# Patient Record
Sex: Male | Born: 1945 | Race: White | Hispanic: No | Marital: Married | Smoking: Former smoker
Health system: Southern US, Community
[De-identification: ages and names within clinical notes are randomized; demographics above are authoritative.]

## PROBLEM LIST (undated history)

## (undated) DIAGNOSIS — L570 Actinic keratosis: Secondary | ICD-10-CM

## (undated) DIAGNOSIS — C4490 Unspecified malignant neoplasm of skin, unspecified: Secondary | ICD-10-CM

## (undated) DIAGNOSIS — N200 Calculus of kidney: Secondary | ICD-10-CM

## (undated) DIAGNOSIS — G629 Polyneuropathy, unspecified: Secondary | ICD-10-CM

## (undated) DIAGNOSIS — M1990 Unspecified osteoarthritis, unspecified site: Secondary | ICD-10-CM

## (undated) DIAGNOSIS — I359 Nonrheumatic aortic valve disorder, unspecified: Secondary | ICD-10-CM

## (undated) DIAGNOSIS — N529 Male erectile dysfunction, unspecified: Secondary | ICD-10-CM

## (undated) HISTORY — DX: Calculus of kidney: N20.0

## (undated) HISTORY — PX: CATARACT EXTRACTION: SUR2

## (undated) HISTORY — DX: Nonrheumatic aortic valve disorder, unspecified: I35.9

## (undated) HISTORY — DX: Actinic keratosis: L57.0

## (undated) HISTORY — DX: Polyneuropathy, unspecified: G62.9

## (undated) HISTORY — DX: Male erectile dysfunction, unspecified: N52.9

## (undated) HISTORY — DX: Unspecified malignant neoplasm of skin, unspecified: C44.90

## (undated) HISTORY — PX: CARPAL TUNNEL RELEASE: SHX101

## (undated) HISTORY — PX: KIDNEY STONE SURGERY: SHX686

---

## 1974-12-18 HISTORY — PX: BACK SURGERY: SHX140

## 2001-10-18 DIAGNOSIS — G609 Hereditary and idiopathic neuropathy, unspecified: Secondary | ICD-10-CM | POA: Insufficient documentation

## 2006-01-24 DIAGNOSIS — N529 Male erectile dysfunction, unspecified: Secondary | ICD-10-CM | POA: Insufficient documentation

## 2008-11-23 ENCOUNTER — Ambulatory Visit (HOSPITAL_COMMUNITY): Admission: RE | Admit: 2008-11-23 | Discharge: 2008-11-23 | Payer: Self-pay | Admitting: Neurosurgery

## 2008-12-28 ENCOUNTER — Ambulatory Visit (HOSPITAL_COMMUNITY): Admission: RE | Admit: 2008-12-28 | Discharge: 2008-12-28 | Payer: Self-pay | Admitting: Neurosurgery

## 2011-04-03 LAB — CBC
HCT: 41.7 % (ref 39.0–52.0)
Hemoglobin: 14.3 g/dL (ref 13.0–17.0)
MCHC: 34.2 g/dL (ref 30.0–36.0)
MCV: 91.8 fL (ref 78.0–100.0)
Platelets: 245 10*3/uL (ref 150–400)
RBC: 4.54 MIL/uL (ref 4.22–5.81)
RDW: 12.9 % (ref 11.5–15.5)
WBC: 5.8 10*3/uL (ref 4.0–10.5)

## 2011-05-02 NOTE — Op Note (Signed)
Matthew Berry, Matthew Berry               ACCOUNT NO.:  [id number]   MEDICAL RECORD NO.:  [id number]          PATIENT TYPE:  AMB   LOCATION:  SDS                          FACILITY:  [facility]   PHYSICIAN:  Hewitt Shorts, M.D.DATE OF BIRTH:  Nov 20, 1946   DATE OF PROCEDURE:  12/28/2008  DATE OF DISCHARGE:  12/28/2008                               OPERATIVE REPORT   PREOPERATIVE DIAGNOSIS:  Bilateral carpal tunnel syndrome.   POSTOPERATIVE DIAGNOSIS:  Bilateral carpal tunnel syndrome.   PROCEDURE:  Right carpal tunnel release.   SURGEON:  Hewitt Shorts, MD   ANESTHESIA:  Bier block with intravenous sedation.   INDICATIONS:  The patient is a 65 year old man with bilateral carpal  tunnel syndrome.  He is about 5-week status post a left carpal tunnel  release.  He has done quite well with that.  He returns now for right  carpal tunnel release.   PROCEDURE:  The patient was brought to the operating room.  A Bier block  was administered by Dr. Katrinka Blazing from the Anesthesia Service and then the  right upper extremity was prepped with Betadine soap and solution and  draped in sterile fashion.  An incision was made on the proximal right  palm, just medial to and parallely thenar crease.  Dissection was  carried down through the subcutaneous tissue to the tranverse carpal  ligament that was carefully divided from distal to proximal.  There was  notable thickening in the mid portion of the tranverse carpal ligament  and presumably this is where the compression was occurring.  The  tranverse carpal ligament was opened fully from distal to proximal and  good decompression of the underlying median nerve was achieved and care  was taken during the dissection to leave the underlying median nerve  undisturbed.  We then proceeded with closure.  The subcutaneous and  subcuticular were closed with interrupted inverted 2-0 undyed Vicryl  sutures.  The skin was reapproximated with interrupted horizontal  mattress sutures with a 4-0 nylon suture.  The wound was dressed with  Adaptic and sterile gauze and wrapped in a Kling, and then the  tourniquet was released. The patient was subsequently transferred to the  recovery room for further care, to be discharged to home when stable.  He was given instruction to keep the right upper extremity elevated and  he has a sling at home to use to help with that.  His daughters are  going to do dressing changes with gauze, sponges, and Kling, and he is  to return in a couple weeks for suture removal.  Sponge and needle count  were correct.  Estimated blood loss was nil.      Hewitt Shorts, M.D.  Electronically Signed     RWN/MEDQ  D:  12/28/2008  T:  12/28/2008  Job:  [id number]

## 2011-05-02 NOTE — Op Note (Signed)
NAMEJIAN, Matthew Berry               ACCOUNT NO.:  [id number]   MEDICAL RECORD NO.:  [id number]          PATIENT TYPE:  AMB   LOCATION:  SDS                          FACILITY:  [facility]   PHYSICIAN:  Hewitt Shorts, M.D.DATE OF BIRTH:  November 19, 1946   DATE OF PROCEDURE:  DATE OF DISCHARGE:                               OPERATIVE REPORT   PREOPERATIVE DIAGNOSES:  Bilateral carpal tunnel syndrome.   POSTOPERATIVE DIAGNOSES:  Bilateral carpal tunnel syndrome.   PROCEDURE:  Left carpal tunnel release.   SURGEON:  Hewitt Shorts, MD.   ANESTHESIA:  Bier block with intravenous sedation.   INDICATIONS:  The patient is a 65 year old man who presented with  bilateral carpal tunnel syndrome, right slightly worse than the left,  both severe.  The patient felt he was more symptomatic on the left and  have requested a left carpal tunnel release, and the patient was brought  to surgery.   PROCEDURE:  The patient was brought to the operating room, a Bier block  was administered to the distal left upper extremity and then the distal  left upper extremity was prepped with Betadine soap and solution, and  draped in a sterile fashion.  An incision was made medial to the left  thenar crease and dissection was carried down through the subcutaneous  tissue to the tranverse carpal ligament that was carefully divided from  distal to proximal taking care to leave the underlying median nerve  undisturbed.  It was opened in its full extend from distal to proximal.  The ligament was noted to be thickened and a clear decompression was  achieved as well as divided.  Once the ligament was fully divided, we  checked for hemostasis which was confirmed, and then we proceeded with  closure.  The subcutaneous layer was closed with interrupted inverted 2-  0 undyed Vicryl sutures. The skin was reapproximated with interrupted  horizontal mattress 4-0 nylon sutures.  The wound was dressed with  Adaptic and gauze  fluffs, and wrapped in a Kling.  The procedure was  tolerated well.  The estimated blood loss was nil.  Sponge and needle  count were correct.  Following the surgery, the patient was transferred  to the recovery room for further care.  To be discharged to home when  stable.      Hewitt Shorts, M.D.  Electronically Signed     RWN/MEDQ  D:  11/23/2008  T:  11/24/2008  Job:  [id number]

## 2011-09-22 LAB — COMPREHENSIVE METABOLIC PANEL
ALT: 21 U/L (ref 0–53)
AST: 20 U/L (ref 0–37)
Albumin: 4.1 g/dL (ref 3.5–5.2)
Alkaline Phosphatase: 71 U/L (ref 39–117)
BUN: 15 mg/dL (ref 6–23)
CO2: 29 mEq/L (ref 19–32)
Calcium: 9.5 mg/dL (ref 8.4–10.5)
Chloride: 107 mEq/L (ref 96–112)
Creatinine, Ser: 0.87 mg/dL (ref 0.4–1.5)
GFR calc Af Amer: >60 mL/min (ref >60)
GFR calc non Af Amer: >60 mL/min (ref >60)
Glucose, Bld: 91 mg/dL (ref 70–99)
Potassium: 4.5 mEq/L (ref 3.5–5.1)
Sodium: 139 mEq/L (ref 135–145)
Total Bilirubin: 0.9 mg/dL (ref 0.3–1.2)
Total Protein: 6.3 g/dL (ref 6.0–8.3)

## 2011-09-22 LAB — CBC
HCT: 42.5 % (ref 39.0–52.0)
Hemoglobin: 14.8 g/dL (ref 13.0–17.0)
MCHC: 34.9 g/dL (ref 30.0–36.0)
MCV: 91.7 fL (ref 78.0–100.0)
Platelets: 217 10*3/uL (ref 150–400)
RBC: 4.63 MIL/uL (ref 4.22–5.81)
RDW: 13.1 % (ref 11.5–15.5)
WBC: 6.8 10*3/uL (ref 4.0–10.5)

## 2012-10-11 ENCOUNTER — Other Ambulatory Visit: Payer: Self-pay | Admitting: Neurosurgery

## 2012-11-11 ENCOUNTER — Encounter (HOSPITAL_COMMUNITY): Payer: Self-pay | Admitting: Pharmacy Technician

## 2012-11-12 ENCOUNTER — Encounter (HOSPITAL_COMMUNITY): Payer: Self-pay

## 2012-11-12 ENCOUNTER — Encounter (HOSPITAL_COMMUNITY)
Admission: RE | Admit: 2012-11-12 | Discharge: 2012-11-12 | Disposition: A | Discharge status: Home / Self Care | Payer: Medicare Other | Source: Ambulatory Visit | Attending: Neurosurgery | Admitting: Neurosurgery

## 2012-11-12 HISTORY — DX: Unspecified osteoarthritis, unspecified site: M19.90

## 2012-11-12 LAB — CBC
MCHC: 33.6 g/dL (ref 30.0–36.0)
Platelets: 206 10*3/uL (ref 150–400)
RDW: 13.2 % (ref 11.5–15.5)

## 2012-11-12 LAB — SURGICAL PCR SCREEN
MRSA, PCR: POSITIVE — AB
Staphylococcus aureus: POSITIVE — AB

## 2012-11-12 LAB — BASIC METABOLIC PANEL
GFR calc Af Amer: >90 mL/min (ref >90)
GFR calc non Af Amer: 86 mL/min — ABNORMAL LOW (ref >90)
Potassium: 5.1 mEq/L (ref 3.5–5.1)
Sodium: 140 mEq/L (ref 135–145)

## 2012-11-12 NOTE — Pre-Procedure Instructions (Signed)
20 CORRI BLAUER  11/12/2012   Your procedure is scheduled on:  Thursday December 5  Report to [facility] Stay [facility] at 5:30 AM.  Call this number if you have problems the morning of surgery: [phone]   Remember:   Do not eat or drink:After Midnight.    Take these medicines the morning of surgery with A SIP OF WATER: none   Do not wear jewelry, make-up or nail polish.  Do not wear lotions, powders, or perfumes. You may wear deodorant.  Do not shave 48 hours prior to surgery. Men may shave face and neck.  Do not bring valuables to the hospital.  Contacts, dentures or bridgework may not be worn into surgery.  Leave suitcase in the car. After surgery it may be brought to your room.  For patients admitted to the hospital, checkout time is 11:00 AM the day of discharge.   Patients discharged the day of surgery will not be allowed to drive home.  Name and phone number of your driver: NA  Special Instructions: Shower using CHG 2 nights before surgery and the night before surgery.  If you shower the day of surgery use CHG.  Use special wash - you have one bottle of CHG for all showers.  You should use approximately 1/3 of the bottle for each shower.   Please read over the following fact sheets that you were given: Pain Booklet, Coughing and Deep Breathing and Surgical Site Infection Prevention

## 2012-11-20 MED ORDER — CEFAZOLIN SODIUM-DEXTROSE 2-3 GM-% IV SOLR
2.0000 g | INTRAVENOUS | Status: AC
Start: 2012-11-21 — End: 2012-11-21
  Administered 2012-11-21: 22 g via INTRAVENOUS
  Filled 2012-11-20: qty 50

## 2012-11-21 ENCOUNTER — Encounter (HOSPITAL_COMMUNITY): Payer: Self-pay | Admitting: Anesthesiology

## 2012-11-21 ENCOUNTER — Ambulatory Visit (HOSPITAL_COMMUNITY): Payer: Medicare Other | Admitting: Anesthesiology

## 2012-11-21 ENCOUNTER — Ambulatory Visit (HOSPITAL_COMMUNITY): Payer: Medicare Other

## 2012-11-21 ENCOUNTER — Encounter (HOSPITAL_COMMUNITY)
Admission: RE | Disposition: A | Discharge status: Home / Self Care | Payer: Self-pay | Source: Ambulatory Visit | Attending: Neurosurgery

## 2012-11-21 ENCOUNTER — Ambulatory Visit (HOSPITAL_COMMUNITY)
Admission: RE | Admit: 2012-11-21 | Discharge: 2012-11-22 | Disposition: A | Discharge status: Home / Self Care | Payer: Medicare Other | Source: Ambulatory Visit | Attending: Neurosurgery | Admitting: Neurosurgery

## 2012-11-21 DIAGNOSIS — Z01812 Encounter for preprocedural laboratory examination: Secondary | ICD-10-CM | POA: Insufficient documentation

## 2012-11-21 DIAGNOSIS — M5137 Other intervertebral disc degeneration, lumbosacral region: Secondary | ICD-10-CM | POA: Insufficient documentation

## 2012-11-21 DIAGNOSIS — M5126 Other intervertebral disc displacement, lumbar region: Secondary | ICD-10-CM | POA: Insufficient documentation

## 2012-11-21 DIAGNOSIS — M51379 Other intervertebral disc degeneration, lumbosacral region without mention of lumbar back pain or lower extremity pain: Secondary | ICD-10-CM | POA: Insufficient documentation

## 2012-11-21 DIAGNOSIS — M47817 Spondylosis without myelopathy or radiculopathy, lumbosacral region: Secondary | ICD-10-CM | POA: Insufficient documentation

## 2012-11-21 DIAGNOSIS — M48062 Spinal stenosis, lumbar region with neurogenic claudication: Secondary | ICD-10-CM | POA: Insufficient documentation

## 2012-11-21 HISTORY — PX: LUMBAR LAMINECTOMY/DECOMPRESSION MICRODISCECTOMY: SHX5026

## 2012-11-21 SURGERY — LUMBAR LAMINECTOMY/DECOMPRESSION MICRODISCECTOMY 2 LEVELS
Anesthesia: General | Site: Back | Wound class: Clean

## 2012-11-21 MED ORDER — ONDANSETRON HCL 4 MG/2ML IJ SOLN
INTRAMUSCULAR | Status: DC | PRN
  Administered 2012-11-21: 4 mg via INTRAVENOUS

## 2012-11-21 MED ORDER — SODIUM CHLORIDE 0.9 % IV SOLN
INTRAVENOUS | Status: AC
  Filled 2012-11-21: qty 500

## 2012-11-21 MED ORDER — ACETAMINOPHEN 10 MG/ML IV SOLN
1000.0000 mg | Freq: Four times a day (QID) | INTRAVENOUS | Status: DC
  Administered 2012-11-21 – 2012-11-22 (×3): 1000 mg via INTRAVENOUS
  Filled 2012-11-21 (×5): qty 100

## 2012-11-21 MED ORDER — ACETAMINOPHEN 10 MG/ML IV SOLN
INTRAVENOUS | Status: AC
  Filled 2012-11-21: qty 100

## 2012-11-21 MED ORDER — HEMOSTATIC AGENTS (NO CHARGE) OPTIME
TOPICAL | Status: DC | PRN
  Administered 2012-11-21 (×2): 1 via TOPICAL

## 2012-11-21 MED ORDER — PHENOL 1.4 % MT LIQD
1.0000 | OROMUCOSAL | Status: DC | PRN
  Administered 2012-11-21: 1 via OROMUCOSAL
  Filled 2012-11-21: qty 177

## 2012-11-21 MED ORDER — GLYCOPYRROLATE 0.2 MG/ML IJ SOLN
INTRAMUSCULAR | Status: DC | PRN
  Administered 2012-11-21: .7 mg via INTRAVENOUS

## 2012-11-21 MED ORDER — MORPHINE SULFATE 4 MG/ML IJ SOLN: 4.0000 mg | INTRAMUSCULAR | Status: DC | PRN

## 2012-11-21 MED ORDER — DEXTROSE-NACL 5-0.45 % IV SOLN: INTRAVENOUS | Status: DC

## 2012-11-21 MED ORDER — LACTATED RINGERS IV SOLN
INTRAVENOUS | Status: DC | PRN
  Administered 2012-11-21 (×2): via INTRAVENOUS

## 2012-11-21 MED ORDER — KETOROLAC TROMETHAMINE 30 MG/ML IJ SOLN
30.0000 mg | Freq: Four times a day (QID) | INTRAMUSCULAR | Status: DC
  Administered 2012-11-21 – 2012-11-22 (×4): 30 mg via INTRAVENOUS
  Filled 2012-11-21 (×9): qty 1

## 2012-11-21 MED ORDER — ACETAMINOPHEN 10 MG/ML IV SOLN
1000.0000 mg | Freq: Once | INTRAVENOUS | Status: AC | PRN
  Administered 2012-11-21: 1000 mg via INTRAVENOUS

## 2012-11-21 MED ORDER — MENTHOL 3 MG MT LOZG
1.0000 | LOZENGE | OROMUCOSAL | Status: DC | PRN
  Administered 2012-11-22: 3 mg via ORAL
  Filled 2012-11-21: qty 9

## 2012-11-21 MED ORDER — CHLORHEXIDINE GLUCONATE CLOTH 2 % EX PADS
6.0000 | MEDICATED_PAD | Freq: Every day | CUTANEOUS | Status: DC
  Administered 2012-11-22: 6 via TOPICAL

## 2012-11-21 MED ORDER — BUPIVACAINE HCL (PF) 0.5 % IJ SOLN
INTRAMUSCULAR | Status: DC | PRN
  Administered 2012-11-21: 11.5 mL

## 2012-11-21 MED ORDER — VANCOMYCIN HCL IN DEXTROSE 1-5 GM/200ML-% IV SOLN
INTRAVENOUS | Status: AC
  Filled 2012-11-21: qty 200

## 2012-11-21 MED ORDER — VANCOMYCIN HCL 1000 MG IV SOLR
1000.0000 mg | INTRAVENOUS | Status: DC | PRN
  Administered 2012-11-21: 1000 mg via INTRAVENOUS

## 2012-11-21 MED ORDER — ARTIFICIAL TEARS OP OINT
TOPICAL_OINTMENT | OPHTHALMIC | Status: DC | PRN
  Administered 2012-11-21: 1 via OPHTHALMIC

## 2012-11-21 MED ORDER — KETOROLAC TROMETHAMINE 30 MG/ML IJ SOLN: 30.0000 mg | Freq: Once | INTRAMUSCULAR | Status: DC

## 2012-11-21 MED ORDER — CYCLOBENZAPRINE HCL 10 MG PO TABS: 10.0000 mg | ORAL_TABLET | Freq: Three times a day (TID) | ORAL | Status: DC | PRN

## 2012-11-21 MED ORDER — HYDROXYZINE HCL 50 MG/ML IM SOLN: 50.0000 mg | INTRAMUSCULAR | Status: DC | PRN

## 2012-11-21 MED ORDER — ACETAMINOPHEN 325 MG PO TABS: 650.0000 mg | ORAL_TABLET | ORAL | Status: DC | PRN

## 2012-11-21 MED ORDER — SODIUM CHLORIDE 0.9 % IJ SOLN
3.0000 mL | Freq: Two times a day (BID) | INTRAMUSCULAR | Status: DC
  Administered 2012-11-21 – 2012-11-22 (×2): 3 mL via INTRAVENOUS

## 2012-11-21 MED ORDER — BACITRACIN 50000 UNITS IM SOLR
INTRAMUSCULAR | Status: AC
  Filled 2012-11-21: qty 1

## 2012-11-21 MED ORDER — SODIUM CHLORIDE 0.9 % IJ SOLN: 3.0000 mL | INTRAMUSCULAR | Status: DC | PRN

## 2012-11-21 MED ORDER — MAGNESIUM HYDROXIDE 400 MG/5ML PO SUSP: 30.0000 mL | Freq: Every day | ORAL | Status: DC | PRN

## 2012-11-21 MED ORDER — ONDANSETRON HCL 4 MG/2ML IJ SOLN: 4.0000 mg | Freq: Once | INTRAMUSCULAR | Status: DC | PRN

## 2012-11-21 MED ORDER — FENTANYL CITRATE 0.05 MG/ML IJ SOLN
INTRAMUSCULAR | Status: DC | PRN
  Administered 2012-11-21: 200 ug via INTRAVENOUS
  Administered 2012-11-21: 50 ug via INTRAVENOUS

## 2012-11-21 MED ORDER — LIDOCAINE HCL (CARDIAC) 20 MG/ML IV SOLN
INTRAVENOUS | Status: DC | PRN
  Administered 2012-11-21: 40 mg via INTRAVENOUS

## 2012-11-21 MED ORDER — ACETAMINOPHEN 650 MG RE SUPP: 650.0000 mg | RECTAL | Status: DC | PRN

## 2012-11-21 MED ORDER — ALUM & MAG HYDROXIDE-SIMETH 200-200-20 MG/5ML PO SUSP: 30.0000 mL | Freq: Four times a day (QID) | ORAL | Status: DC | PRN

## 2012-11-21 MED ORDER — SODIUM CHLORIDE 0.9 % IR SOLN
Status: DC | PRN
  Administered 2012-11-21: 08:00:00

## 2012-11-21 MED ORDER — BISACODYL 10 MG RE SUPP: 10.0000 mg | Freq: Every day | RECTAL | Status: DC | PRN

## 2012-11-21 MED ORDER — MIDAZOLAM HCL 5 MG/5ML IJ SOLN
INTRAMUSCULAR | Status: DC | PRN
  Administered 2012-11-21: 2 mg via INTRAVENOUS

## 2012-11-21 MED ORDER — THROMBIN 5000 UNITS EX SOLR
CUTANEOUS | Status: DC | PRN
  Administered 2012-11-21 (×3): 5000 [IU] via TOPICAL

## 2012-11-21 MED ORDER — HYDROMORPHONE HCL PF 1 MG/ML IJ SOLN
INTRAMUSCULAR | Status: AC
  Administered 2012-11-21: 0.5 mg via INTRAVENOUS
  Filled 2012-11-21: qty 1

## 2012-11-21 MED ORDER — PROPOFOL 10 MG/ML IV BOLUS
INTRAVENOUS | Status: DC | PRN
  Administered 2012-11-21: 200 mg via INTRAVENOUS

## 2012-11-21 MED ORDER — ZOLPIDEM TARTRATE 5 MG PO TABS: 5.0000 mg | ORAL_TABLET | Freq: Every evening | ORAL | Status: DC | PRN

## 2012-11-21 MED ORDER — EPHEDRINE SULFATE 50 MG/ML IJ SOLN
INTRAMUSCULAR | Status: DC | PRN
  Administered 2012-11-21: 10 mg via INTRAVENOUS

## 2012-11-21 MED ORDER — HYDROMORPHONE HCL PF 1 MG/ML IJ SOLN
0.2500 mg | INTRAMUSCULAR | Status: DC | PRN
  Administered 2012-11-21: 0.5 mg via INTRAVENOUS

## 2012-11-21 MED ORDER — OXYCODONE HCL 5 MG PO TABS
5.0000 mg | ORAL_TABLET | ORAL | Status: DC | PRN
  Administered 2012-11-21 – 2012-11-22 (×2): 5 mg via ORAL
  Filled 2012-11-21 (×2): qty 1

## 2012-11-21 MED ORDER — HYDROXYZINE HCL 25 MG PO TABS: 50.0000 mg | ORAL_TABLET | ORAL | Status: DC | PRN

## 2012-11-21 MED ORDER — LIDOCAINE-EPINEPHRINE 1 %-1:100000 IJ SOLN
INTRAMUSCULAR | Status: DC | PRN
  Administered 2012-11-21: 11.5 mL

## 2012-11-21 MED ORDER — VECURONIUM BROMIDE 10 MG IV SOLR
INTRAVENOUS | Status: DC | PRN
  Administered 2012-11-21: 7 mg via INTRAVENOUS
  Administered 2012-11-21: 3 mg via INTRAVENOUS

## 2012-11-21 MED ORDER — NEOSTIGMINE METHYLSULFATE 1 MG/ML IJ SOLN
INTRAMUSCULAR | Status: DC | PRN
  Administered 2012-11-21: 3.5 mg via INTRAVENOUS

## 2012-11-21 SURGICAL SUPPLY — 63 items
BAG DECANTER FOR FLEXI CONT (MISCELLANEOUS) ×2 IMPLANT
BENZOIN TINCTURE PRP APPL 2/3 (GAUZE/BANDAGES/DRESSINGS) IMPLANT
BLADE SURG ROTATE 9660 (MISCELLANEOUS) ×2 IMPLANT
BRUSH SCRUB EZ PLAIN DRY (MISCELLANEOUS) ×2 IMPLANT
BUR ACORN 6.0 ACORN (BURR) ×2 IMPLANT
BUR ACRON 5.0MM COATED (BURR) IMPLANT
BUR MATCHSTICK NEURO 3.0 LAGG (BURR) ×2 IMPLANT
CANISTER SUCTION 2500CC (MISCELLANEOUS) ×2 IMPLANT
CLOTH BEACON ORANGE TIMEOUT ST (SAFETY) ×2 IMPLANT
CONT SPEC 4OZ CLIKSEAL STRL BL (MISCELLANEOUS) ×2 IMPLANT
DERMABOND ADHESIVE PROPEN (GAUZE/BANDAGES/DRESSINGS) ×2
DERMABOND ADVANCED (GAUZE/BANDAGES/DRESSINGS)
DERMABOND ADVANCED .7 DNX12 (GAUZE/BANDAGES/DRESSINGS) IMPLANT
DERMABOND ADVANCED .7 DNX6 (GAUZE/BANDAGES/DRESSINGS) ×2 IMPLANT
DRAPE LAPAROTOMY 100X72X124 (DRAPES) ×2 IMPLANT
DRAPE MICROSCOPE LEICA (MISCELLANEOUS) ×2 IMPLANT
DRAPE POUCH INSTRU U-SHP 10X18 (DRAPES) ×2 IMPLANT
DRSG EMULSION OIL 3X3 NADH (GAUZE/BANDAGES/DRESSINGS) IMPLANT
ELECT REM PT RETURN 9FT ADLT (ELECTROSURGICAL) ×2
ELECTRODE REM PT RTRN 9FT ADLT (ELECTROSURGICAL) ×1 IMPLANT
GAUZE SPONGE 4X4 16PLY XRAY LF (GAUZE/BANDAGES/DRESSINGS) IMPLANT
GLOVE BIO SURGEON STRL SZ8 (GLOVE) ×2 IMPLANT
GLOVE BIOGEL PI IND STRL 8 (GLOVE) ×1 IMPLANT
GLOVE BIOGEL PI IND STRL 8.5 (GLOVE) ×3 IMPLANT
GLOVE BIOGEL PI INDICATOR 8 (GLOVE) ×1
GLOVE BIOGEL PI INDICATOR 8.5 (GLOVE) ×3
GLOVE ECLIPSE 7.5 STRL STRAW (GLOVE) ×2 IMPLANT
GLOVE EXAM NITRILE LRG STRL (GLOVE) IMPLANT
GLOVE EXAM NITRILE MD LF STRL (GLOVE) ×2 IMPLANT
GLOVE EXAM NITRILE XL STR (GLOVE) IMPLANT
GLOVE EXAM NITRILE XS STR PU (GLOVE) IMPLANT
GLOVE SURG SS PI 8.0 STRL IVOR (GLOVE) ×6 IMPLANT
GOWN BRE IMP SLV AUR LG STRL (GOWN DISPOSABLE) ×2 IMPLANT
GOWN BRE IMP SLV AUR XL STRL (GOWN DISPOSABLE) ×2 IMPLANT
GOWN STRL REIN 2XL LVL4 (GOWN DISPOSABLE) ×4 IMPLANT
HEMOSTAT POWDER SURGIFOAM 1G (HEMOSTASIS) ×2 IMPLANT
KIT BASIN OR (CUSTOM PROCEDURE TRAY) ×2 IMPLANT
KIT ROOM TURNOVER OR (KITS) ×2 IMPLANT
NEEDLE HYPO 18GX1.5 BLUNT FILL (NEEDLE) IMPLANT
NEEDLE SPNL 18GX3.5 QUINCKE PK (NEEDLE) ×4 IMPLANT
NEEDLE SPNL 22GX3.5 QUINCKE BK (NEEDLE) ×2 IMPLANT
NS IRRIG 1000ML POUR BTL (IV SOLUTION) ×2 IMPLANT
PACK LAMINECTOMY NEURO (CUSTOM PROCEDURE TRAY) ×2 IMPLANT
PAD ARMBOARD 7.5X6 YLW CONV (MISCELLANEOUS) ×6 IMPLANT
PATTIES SURGICAL .5 X1 (DISPOSABLE) ×2 IMPLANT
PATTIES SURGICAL 1X1 (DISPOSABLE) ×2 IMPLANT
RUBBERBAND STERILE (MISCELLANEOUS) ×4 IMPLANT
SPONGE GAUZE 4X4 12PLY (GAUZE/BANDAGES/DRESSINGS) ×4 IMPLANT
SPONGE LAP 4X18 X RAY DECT (DISPOSABLE) IMPLANT
SPONGE NEURO XRAY DETECT 1X3 (DISPOSABLE) ×2 IMPLANT
SPONGE SURGIFOAM ABS GEL SZ50 (HEMOSTASIS) ×2 IMPLANT
STRIP CLOSURE SKIN 1/2X4 (GAUZE/BANDAGES/DRESSINGS) IMPLANT
SUT PROLENE 6 0 BV (SUTURE) IMPLANT
SUT VIC AB 1 CT1 18XBRD ANBCTR (SUTURE) ×2 IMPLANT
SUT VIC AB 1 CT1 8-18 (SUTURE) ×2
SUT VIC AB 2-0 CP2 18 (SUTURE) ×4 IMPLANT
SUT VIC AB 3-0 SH 8-18 (SUTURE) ×4 IMPLANT
SYR 20CC LL (SYRINGE) ×2 IMPLANT
SYR 5ML LL (SYRINGE) IMPLANT
TAPE CLOTH SURG 4X10 WHT LF (GAUZE/BANDAGES/DRESSINGS) ×2 IMPLANT
TOWEL OR 17X24 6PK STRL BLUE (TOWEL DISPOSABLE) ×2 IMPLANT
TOWEL OR 17X26 10 PK STRL BLUE (TOWEL DISPOSABLE) ×2 IMPLANT
WATER STERILE IRR 1000ML POUR (IV SOLUTION) ×2 IMPLANT

## 2012-11-21 NOTE — Progress Notes (Signed)
Filed Vitals:   11/21/12 1130 11/21/12 1145 11/21/12 1215 11/21/12 1659  BP: 111/58 121/59 111/75 125/65  Pulse: 52 52 49 69  Temp:  98.4 F (36.9 C)  98 F (36.7 C)  TempSrc:      Resp: 22 15 16 20   SpO2: 100% 100% 98% 99%    Patient resting in bed, comfortable. Has been up and ambulating in halls. Has voided. Moving all extremities well. Dressing clean and dry.  Plan: Continue progress to postoperative recovery.  Hewitt Shorts, MD 11/21/2012, 6:28 PM

## 2012-11-21 NOTE — Plan of Care (Signed)
Problem: Consults Goal: Diagnosis - Spinal Surgery Outcome: Completed/Met Date Met:  11/21/12 Lumbar Laminectomy (Complex)

## 2012-11-21 NOTE — Anesthesia Postprocedure Evaluation (Signed)
  Anesthesia Post-op Note  Patient: Matthew Berry  Procedure(s) Performed: Procedure(s) (LRB) with comments: LUMBAR LAMINECTOMY/DECOMPRESSION MICRODISCECTOMY 2 LEVELS (N/A) - Lumbar two to lumbar four laminectomies  Patient Location: PACU  Anesthesia Type:General  Level of Consciousness: awake, alert  and oriented  Airway and Oxygen Therapy: Patient Spontanous Breathing and Patient connected to nasal cannula oxygen  Post-op Pain: mild  Post-op Assessment: Post-op Vital signs reviewed, Patient's Cardiovascular Status Stable, Respiratory Function Stable, No signs of Nausea or vomiting and Pain level controlled  Post-op Vital Signs: stable  Complications: No apparent anesthesia complications

## 2012-11-21 NOTE — Preoperative (Signed)
Beta Blockers   Reason not to administer Beta Blockers:Not Applicable 

## 2012-11-21 NOTE — Progress Notes (Signed)
Pt. Stated he completed preop 5 day course of Mupricin.

## 2012-11-21 NOTE — H&P (Signed)
Subjective: Patient is a 66 y.o. male who is admitted for treatment of multilevel multifactorial lumbar stenosis. Patient presents with several year history of low back pain extending into the buttocks and thighs bilaterally, with numbness in his feet extending back into the right calf, and also some cramping in the right calf. The pain is typically worse with standing and walking, and typically relieved with sitting and laying. He is admitted now for an L2-L4 decompressive lumbar laminectomy, and possible microdiscectomy (a small disc protrusion seen on the left side at L2-3 extending caudally behind the body of L3, and we will examine this intraoperatively and decide whether discectomy warranted.    Past Medical History  Diagnosis Date  . Arthritis     Past Surgical History  Procedure Date  . Carpal tunnel release 2009, 2010    bilat  . Back surgery 1976    disc surgery   . Kidney stone surgery     Prescriptions prior to admission  Medication Sig Dispense Refill  . ibuprofen (ADVIL,MOTRIN) 800 MG tablet Take 800 mg by mouth every 8 (eight) hours as needed. Pain.      . Multiple Vitamin (MULTIVITAMIN WITH MINERALS) TABS Take 1 tablet by mouth daily.      . naproxen sodium (ANAPROX) 220 MG tablet Take 440 mg by mouth 2 (two) times daily as needed. pain      . Lido-Capsaicin-Men-Methyl Sal (MEDI-PATCH-LIDOCAINE) 0.5-0.035-5-20 % PTCH Apply 1 patch topically as needed. On 12h off 12h       Allergies  Allergen Reactions  . Sulfa Antibiotics Other (See Comments)    Unknown reaction.     History  Substance Use Topics  . Smoking status: Former Smoker    Quit date: 12/18/1973  . Smokeless tobacco: Not on file  . Alcohol Use: No    No family history on file.   Review of Systems A comprehensive review of systems was negative.  Objective: Vital signs in last 24 hours: Temp:  [97.8 F (36.6 C)] 97.8 F (36.6 C) (12/05 0631) Pulse Rate:  [71] 71  (12/05 0631) Resp:  [20] 20  (12/05  0631) BP: (124)/(73) 124/73 mmHg (12/05 0631) SpO2:  [97 %] 97 % (12/05 0631)  EXAM: Patient well-developed nourished white male in no acute distress. Lungs are clear to auscultation , the patient has symmetrical respiratory excursion. Heart has a regular rate and rhythm normal S1 and S2 no murmur.   Abdomen is soft nontender nondistended bowel sounds are present. Extremity examination shows no clubbing cyanosis or edema. Musculoskeletal examination shows no tenderness to palpation of the lumbar spinous processes or paralumbar musculature. He he is able to flex to 90 and able to extend to 10. Straight leg raising is negative bilaterally. Motor examination shows 5 over 5 strength in the lower extremities including the iliopsoas quadriceps dorsiflexor extensor hallicus  longus and plantar flexor bilaterally. Sensation is intact to pinprick in the distal lower extremities. Reflexes are symmetrical bilaterally. No pathologic reflexes are present. Patient has a normal gait and stance.    Data Review:CBC    Component Value Date/Time   WBC 6.2 11/12/2012 1021   RBC 5.08 11/12/2012 1021   HGB 15.6 11/12/2012 1021   HCT 46.4 11/12/2012 1021   PLT 206 11/12/2012 1021   MCV 91.3 11/12/2012 1021   MCH 30.7 11/12/2012 1021   MCHC 33.6 11/12/2012 1021   RDW 13.2 11/12/2012 1021  BMET    Component Value Date/Time   NA 140 11/12/2012 1021   K 5.1 11/12/2012 1021   CL 104 11/12/2012 1021   CO2 29 11/12/2012 1021   GLUCOSE 90 11/12/2012 1021   BUN 15 11/12/2012 1021   CREATININE 0.90 11/12/2012 1021   CALCIUM 9.7 11/12/2012 1021   GFRNONAA 86* 11/12/2012 1021   GFRAA >90 11/12/2012 1021     Assessment/Plan: Patient with severe stenosis at L2-3 and marked stenosis at L3-4, multifactorial in nature at each level with more mild degenerative changes at adjacent levels. There is also a spondylitic disc protrusion with a possible small fragment to the left at L2-3 extending  caudally behind the body of L3.  Patient admitted now for a decompressive lumbar laminectomy from L2-L4, and possible microdiscectomy. I've discussed with the patient the nature of his condition, the nature the surgical procedure, the typical length of surgery, hospital stay, and overall recuperation. We discussed limitations postoperatively. I discussed risks of surgery including risks of infection, bleeding, possibly need for transfusion, the risk of nerve root dysfunction with pain, weakness, numbness, or paresthesias, or risk of dural tear and CSF leakage and possible need for further surgery, the risk of recurrent disc herniation and the possible need for further surgery, and the risk of anesthetic complications including myocardial infarction, stroke, pneumonia, and death. Understanding all this the patient does wish to proceed with surgery and is admitted for such.    Hewitt Shorts, MD 11/21/2012 7:02 AM

## 2012-11-21 NOTE — Transfer of Care (Signed)
Immediate Anesthesia Transfer of Care Note  Patient: Matthew Berry  Procedure(s) Performed: Procedure(s) (LRB) with comments: LUMBAR LAMINECTOMY/DECOMPRESSION MICRODISCECTOMY 2 LEVELS (N/A) - Lumbar two to lumbar four laminectomies  Patient Location: PACU  Anesthesia Type:General  Level of Consciousness: awake, alert  and oriented  Airway & Oxygen Therapy: Patient Spontanous Breathing and Patient connected to nasal cannula oxygen  Post-op Assessment: Report given to PACU RN, Post -op Vital signs reviewed and stable and Patient moving all extremities X 4  Post vital signs: Reviewed and stable  Complications: No apparent anesthesia complications

## 2012-11-21 NOTE — Anesthesia Postprocedure Evaluation (Signed)
  Anesthesia Post-op Note  Patient: Matthew Berry  Procedure(s) Performed: Procedure(s) (LRB) with comments: LUMBAR LAMINECTOMY/DECOMPRESSION MICRODISCECTOMY 2 LEVELS (N/A) - Lumbar two to lumbar four laminectomies  Patient Location: PACU  Anesthesia Type:General  Level of Consciousness: awake, alert  and oriented  Airway and Oxygen Therapy: Patient Spontanous Breathing and Patient connected to nasal cannula oxygen  Post-op Pain: mild  Post-op Assessment: Post-op Vital signs reviewed and Patient's Cardiovascular Status Stable  Post-op Vital Signs: stable  Complications: No apparent anesthesia complications

## 2012-11-21 NOTE — Anesthesia Preprocedure Evaluation (Signed)
Anesthesia Evaluation  Patient identified by MRN, date of birth, ID band Patient awake    Reviewed: Allergy & Precautions, H&P , NPO status , Patient's Chart, lab work & pertinent test results  Airway Mallampati: II      Dental  (+) Teeth Intact and Dental Advisory Given   Pulmonary  breath sounds clear to auscultation        Cardiovascular Rhythm:Regular Rate:Normal     Neuro/Psych    GI/Hepatic   Endo/Other    Renal/GU      Musculoskeletal   Abdominal   Peds  Hematology   Anesthesia Other Findings   Reproductive/Obstetrics                           Anesthesia Physical Anesthesia Plan  ASA: III  Anesthesia Plan: General   Post-op Pain Management:    Induction: Intravenous  Airway Management Planned: Oral ETT  Additional Equipment:   Intra-op Plan:   Post-operative Plan: Extubation in OR  Informed Consent: I have reviewed the patients History and Physical, chart, labs and discussed the procedure including the risks, benefits and alternatives for the proposed anesthesia with the patient or authorized representative who has indicated his/her understanding and acceptance.   Dental advisory given  Plan Discussed with: CRNA and Surgeon  Anesthesia Plan Comments: (Lumbar Spinal Stenosis  Plan GA with oral ETT  Kipp Brood, MD)        Anesthesia Quick Evaluation

## 2012-11-21 NOTE — Op Note (Signed)
11/21/2012  10:00 AM  PATIENT:  Matthew Berry  66 y.o. 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  ANESTHESIA:   general  EBL:  Total I/O In: 1650 [I.V.:1400; IV Piggyback:250] Out: 150 [Blood:150]  BLOOD ADMINISTERED:none  COUNT: Correct per nursing staff  DICTATION: Patient was brought to the operating room placed under general endotracheal anesthesia. Patient was turned to a prone position the lumbar region was prepped with Betadine soap and solution and draped in a sterile fashion. The midline was infiltrated with local anesthetic with epinephrine. A localizing x-ray was taken and then a midline incision was made carried down thru the subcutaneous tissue, bipolar cautery and electrocautery were used to maintain hemostasis. Dissection was carried down to the lumbar fascia which was incised bilaterally and the paraspinal muscles were dissected from the spinous process and lamina in a subperiosteal fashion. Another localizing x-ray was taken and the L2, L3, and L4 levels were identified. Laminectomy was begun with double-action rongeurs a high-speed drill and Kerrison punches. The thickened ligamentum flavum was carefully removed. Dissection was carried laterally to decompress the lateral stenosis taking care to leave the facet complexes intact. The thickened ligamentum flavum located laterally was carefully removed so as  to decompress the stenotic compression of the exiting L2, L3, and L4 nerve roots. We examined what appeared to be a disc herniation extended caudally behind the body of L3. However once we had completed the decompressive laminectomy it is felt that the thecal sac had been well decompressed, and that further decompression by removing the chronic disc protrusion would not significantly increase the extent the decompression. Once the decompression was completed hemostasis was established with the use of bipolar cautery, Gelfoam with thrombin, and surgical. Paraspinal muscles deep fascia and Scarpa's fascia were closed in separate layers with interrupted 1 undyed Vicryl sutures. The subcutaneous and subcuticular were closed with interrupted inverted 2-0 undyed Vicryl sutures. Skin edges were approximated with Dermabond.   PLAN OF CARE: Admit for overnight observation  PATIENT DISPOSITION:  PACU - hemodynamically stable.   Delay start of Pharmacological VTE agent (>24hrs) due to surgical blood loss or risk of bleeding:  yes

## 2012-11-21 NOTE — Progress Notes (Signed)
Pt having initial periods of apnea during wake up from anesthesia.  Easily stimulated and cough/deep breath.

## 2012-11-22 ENCOUNTER — Encounter (HOSPITAL_COMMUNITY): Payer: Self-pay | Admitting: Neurosurgery

## 2012-11-22 MED ORDER — HYDROCODONE-ACETAMINOPHEN 5-325 MG PO TABS: 1.0000 | ORAL_TABLET | ORAL | Status: DC | PRN

## 2012-11-22 NOTE — Discharge Summary (Signed)
Physician Discharge Summary  Patient ID: Matthew Berry MRN: [id number] DOB/AGE: 04-02-1946 66 y.o.  Admit date: 11/21/2012 Discharge date: 11/22/2012  Admission Diagnoses:  Lumbar stenosis, lumbar spondylosis, lumbar degenerative disc disease, neurogenic claudication  Discharge Diagnoses:  Lumbar stenosis, lumbar spondylosis, lumbar degenerative disc disease, neurogenic claudication  Discharged Condition: good  Hospital Course: Patient was admitted underwent an L2-L4 decompressive lumbar laminectomy. He has done well following surgery. He has had good relief of his neurogenic claudication. His wound is healing nicely. She's been up and ambulating. He is being discharged home with instructions regarding wound care and activities. He is to return for followup with me in 3 weeks.  Discharge Exam: Blood pressure 97/53, pulse 78, temperature 99.6 F (37.6 C), temperature source Oral, resp. rate 18, SpO2 95.00%.  Disposition: Home      Medication List     As of 11/22/2012  7:56 AM    TAKE these medications         HYDROcodone-acetaminophen 5-325 MG per tablet   Commonly known as: NORCO/VICODIN   Take 1-2 tablets by mouth every 4 (four) hours as needed.      ibuprofen 800 MG tablet   Commonly known as: ADVIL,MOTRIN   Take 800 mg by mouth every 8 (eight) hours as needed. Pain.      MEDI-PATCH-LIDOCAINE 0.5-0.035-5-20 % Ptch   Generic drug: Lido-Capsaicin-Men-Methyl Sal   Apply 1 patch topically as needed. On 12h off 12h      multivitamin with minerals Tabs   Take 1 tablet by mouth daily.      naproxen sodium 220 MG tablet   Commonly known as: ANAPROX   Take 440 mg by mouth 2 (two) times daily as needed. pain         Signed: Hewitt Shorts, MD 11/22/2012, 7:56 AM

## 2012-12-18 DIAGNOSIS — I4891 Unspecified atrial fibrillation: Secondary | ICD-10-CM

## 2012-12-18 HISTORY — DX: Unspecified atrial fibrillation: I48.91

## 2014-01-14 DIAGNOSIS — I480 Paroxysmal atrial fibrillation: Secondary | ICD-10-CM | POA: Insufficient documentation

## 2016-06-14 DIAGNOSIS — K5790 Diverticulosis of intestine, part unspecified, without perforation or abscess without bleeding: Secondary | ICD-10-CM | POA: Insufficient documentation

## 2022-03-06 ENCOUNTER — Other Ambulatory Visit: Payer: Self-pay

## 2022-03-06 ENCOUNTER — Encounter (HOSPITAL_BASED_OUTPATIENT_CLINIC_OR_DEPARTMENT_OTHER): Payer: Self-pay

## 2022-03-06 ENCOUNTER — Emergency Department (HOSPITAL_BASED_OUTPATIENT_CLINIC_OR_DEPARTMENT_OTHER)
Admission: EM | Admit: 2022-03-06 | Discharge: 2022-03-06 | Disposition: A | Discharge status: Home / Self Care | Payer: Medicare Other | Attending: Emergency Medicine | Admitting: Emergency Medicine

## 2022-03-06 ENCOUNTER — Emergency Department (HOSPITAL_BASED_OUTPATIENT_CLINIC_OR_DEPARTMENT_OTHER): Payer: Medicare Other | Admitting: Radiology

## 2022-03-06 DIAGNOSIS — Z20822 Contact with and (suspected) exposure to covid-19: Secondary | ICD-10-CM | POA: Insufficient documentation

## 2022-03-06 DIAGNOSIS — R911 Solitary pulmonary nodule: Secondary | ICD-10-CM | POA: Diagnosis not present

## 2022-03-06 DIAGNOSIS — R5383 Other fatigue: Secondary | ICD-10-CM | POA: Insufficient documentation

## 2022-03-06 LAB — URINALYSIS, ROUTINE W REFLEX MICROSCOPIC
Bilirubin Urine: NEGATIVE
Glucose, UA: NEGATIVE mg/dL
Ketones, ur: NEGATIVE mg/dL
Leukocytes,Ua: NEGATIVE
Nitrite: NEGATIVE
Protein, ur: NEGATIVE mg/dL
Specific Gravity, Urine: 1.011 (ref 1.005–1.030)
pH: 6.5 (ref 5.0–8.0)

## 2022-03-06 LAB — CBC
HCT: 47.6 % (ref 39.0–52.0)
Hemoglobin: 15.7 g/dL (ref 13.0–17.0)
MCH: 30.8 pg (ref 26.0–34.0)
MCHC: 33 g/dL (ref 30.0–36.0)
MCV: 93.3 fL (ref 80.0–100.0)
Platelets: 215 10*3/uL (ref 150–400)
RBC: 5.1 MIL/uL (ref 4.22–5.81)
RDW: 13.8 % (ref 11.5–15.5)
WBC: 6.8 10*3/uL (ref 4.0–10.5)
nRBC: 0 % (ref 0.0–0.2)

## 2022-03-06 LAB — BASIC METABOLIC PANEL
Anion gap: 7 (ref 5–15)
BUN: 22 mg/dL (ref 8–23)
CO2: 26 mmol/L (ref 22–32)
Calcium: 10 mg/dL (ref 8.9–10.3)
Chloride: 106 mmol/L (ref 98–111)
Creatinine, Ser: 0.96 mg/dL (ref 0.61–1.24)
GFR, Estimated: >60 mL/min (ref >60)
Glucose, Bld: 94 mg/dL (ref 70–99)
Potassium: 4.5 mmol/L (ref 3.5–5.1)
Sodium: 139 mmol/L (ref 135–145)

## 2022-03-06 LAB — TROPONIN I (HIGH SENSITIVITY)
Troponin I (High Sensitivity): 5 ng/L (ref <18)
Troponin I (High Sensitivity): 5 ng/L (ref <18)

## 2022-03-06 LAB — D-DIMER, QUANTITATIVE: D-Dimer, Quant: 0.31 ug/mL-FEU (ref 0.00–0.50)

## 2022-03-06 LAB — MAGNESIUM: Magnesium: 2 mg/dL (ref 1.7–2.4)

## 2022-03-06 LAB — RESP PANEL BY RT-PCR (FLU A&B, COVID) ARPGX2
Influenza A by PCR: NEGATIVE
Influenza B by PCR: NEGATIVE
SARS Coronavirus 2 by RT PCR: NEGATIVE

## 2022-03-06 NOTE — ED Triage Notes (Signed)
Patient here POV from Home with Fatigue. ? ?Patient endorses Fatigue that began this Weekend and has been constant since. Associated with Chest Pressure in Right Chest.  ? ?History of Atrial Fib. No Recent Changes in Medications or PMH. ? ?No N/V/D. No SOB.  ? ?NAD Noted during Triage. A&Ox4. GCS 15. Ambulatory.  ?

## 2022-03-06 NOTE — ED Notes (Signed)
Pt currently unable to provide urine sample. Pt given urinal and instructed to press call button after he uses it. ?

## 2022-03-06 NOTE — Discharge Instructions (Addendum)
There is no clear cause for your fatigue however your tests today were reassuring.  A TSH level to check your thyroid is still pending.  We will call you if the results are abnormal.  The radiologist did note an incidental nodule in your right lung.  Follow-up with your primary care doctor to arrange a CT scan of the chest ?

## 2022-03-06 NOTE — ED Provider Notes (Signed)
?[facility] EMERGENC[facility] DEPT ?Provider Note ? ? ?CSN: [id number] ?Arrival date & time: 03/06/22  1709 ? ?  ? ?History ? ?Chief Complaint  ?Patient presents with  ? Fatigue  ? ? ?Matthew Berry is a 76 y.o. male. ? ?HPI ?Patient has history of A-fib but otherwise no other significant medical problems ? Pt states he was feeling tired over the weekend.  Pt also noticed some pressure on the right side of his chest.  He also noticed some burning indigestion.  NO nausea or vomiting, no shortness of breath.  No palpitations.  No fevers or chills.  No cough.  No vomiting or diarrhea.  Wife was concerned because usually he has a lot more energy. ? ?Home Medications ?Prior to Admission medications   ?Medication Sig Start Date End Date Taking? Authorizing Provider  ?HYDROcodone-acetaminophen (NORCO/VICODIN) 5-325 MG per tablet Take 1-2 tablets by mouth every 4 (four) hours as needed. 11/22/12   Jovita Gamma, MD  ?ibuprofen (ADVIL,MOTRIN) 800 MG tablet Take 800 mg by mouth every 8 (eight) hours as needed. Pain.    [provider]  ?Lido-Capsaicin-Men-Methyl Sal (MEDI-PATCH-LIDOCAINE) 0.5-0.035-5-20 % PTCH Apply 1 patch topically as needed. On 12h off 12h    [provider]  ?Multiple Vitamin (MULTIVITAMIN WITH MINERALS) TABS Take 1 tablet by mouth daily.    [provider]  ?naproxen sodium (ANAPROX) 220 MG tablet Take 440 mg by mouth 2 (two) times daily as needed. pain    [provider]  ?   ? ?Allergies    ?Sulfa antibiotics   ? ?Review of Systems   ?Review of Systems  ?Constitutional:  Negative for fatigue.  ? ?Physical Exam ?Updated Vital Signs ?BP 119/82 (BP Location: Left Arm)   Pulse (!) 45   Temp 97.8 ?F (36.6 ?C) (Oral)   Resp 19   Ht 1.829 m (6')   Wt 83.6 kg   SpO2 98%   BMI 25.00 kg/m?  ?Physical Exam ?Vitals and nursing note reviewed.  ?Constitutional:   ?   General: He is not in acute distress. ?   Appearance: He is well-developed.  ?HENT:  ?   Head:  Normocephalic and atraumatic.  ?   Right Ear: External ear normal.  ?   Left Ear: External ear normal.  ?Eyes:  ?   General: No scleral icterus.    ?   Right eye: No discharge.     ?   Left eye: No discharge.  ?   Conjunctiva/sclera: Conjunctivae normal.  ?Neck:  ?   Trachea: No tracheal deviation.  ?Cardiovascular:  ?   Rate and Rhythm: Normal rate. Rhythm irregular.  ?Pulmonary:  ?   Effort: Pulmonary effort is normal. No respiratory distress.  ?   Breath sounds: Normal breath sounds. No stridor. No wheezing or rales.  ?Abdominal:  ?   General: Bowel sounds are normal. There is no distension.  ?   Palpations: Abdomen is soft.  ?   Tenderness: There is no abdominal tenderness. There is no guarding or rebound.  ?Musculoskeletal:     ?   General: No tenderness or deformity.  ?   Cervical back: Neck supple.  ?Skin: ?   General: Skin is warm and dry.  ?   Findings: No rash.  ?Neurological:  ?   General: No focal deficit present.  ?   Mental Status: He is alert.  ?   Cranial Nerves: No cranial nerve deficit (no facial droop, extraocular movements intact, no slurred  speech).  ?   Sensory: No sensory deficit.  ?   Motor: No abnormal muscle tone or seizure activity.  ?   Coordination: Coordination normal.  ?Psychiatric:     ?   Mood and Affect: Mood normal.  ? ? ?ED Results / Procedures / Treatments   ?Labs ?(all labs ordered are listed, but only abnormal results are displayed) ?Labs Reviewed  ?URINALYSIS, ROUTINE W REFLEX MICROSCOPIC - Abnormal; Notable for the following components:  ?    Result Value  ? Color, Urine COLORLESS (*)   ? Hgb urine dipstick TRACE (*)   ? All other components within normal limits  ?RESP PANEL BY RT-PCR (FLU A&B, COVID) ARPGX2  ?BASIC METABOLIC PANEL  ?CBC  ?TSH  ?MAGNESIUM  ?D-DIMER, QUANTITATIVE  ?TROPONIN I (HIGH SENSITIVITY)  ?TROPONIN I (HIGH SENSITIVITY)  ? ? ?EKG ?EKG Interpretation ? ?Date/Time:  Monday March 06 2022 17:18:39 EDT ?Ventricular Rate:  74 ?PR Interval:    ?QRS  Duration: 98 ?QT Interval:  368 ?QTC Calculation: 408 ?R Axis:   28 ?Text Interpretation: Atrial fibrillation with premature ventricular or aberrantly conducted complexes Septal infarct , age undetermined Abnormal ECG When compared with ECG of 17-Nov-2008 12:22, Atrial fibrillation has replaced Sinus rhythm Septal infarct is now Present Inverted T waves have replaced nonspecific T wave abnormality in Inferior leads Confirmed by Dorie Rank [phone]) on 03/06/2022 5:36:05 PM ? ?Radiology ?DG Chest 2 View ? ?Result Date: 03/06/2022 ?CLINICAL DATA:  Chest pain. EXAM: CHEST - 2 VIEW COMPARISON:  None. FINDINGS: Questionable focal nodular opacity in the right upper lobe measuring 16 mm. The lungs are otherwise clear. Cardiomediastinal silhouette within normal limits. No acute fractures are seen. IMPRESSION: 1. Questionable focal nodular opacity in the right upper lobe. Recommend follow-up nonemergent chest CT. Electronically Signed   By: Ronney Asters M.D.   On: 03/06/2022 17:58   ? ?Procedures ?Procedures  ? ? ?Medications Ordered in ED ?Medications - No data to display ? ?ED Course/ Medical Decision Making/ A&P ?Clinical Course as of 03/07/22 1603  ?Mon Mar 06, 2022  ?2249 Resp Panel by RT-PCR (Flu A&B, Covid) Nasopharyngeal Swab ?Covid and flu are negative [JK]  ?2249 Urinalysis, Routine w reflex microscopic(!) ?Negative [JK]  ?2250 D-dimer, quantitative ?Negative [JK]  ?2250 Troponin I (High Sensitivity) ?Normal [JK]  ?7622 Basic metabolic panel ?Normal [JK]  ?2250 CBC ?Normal [JK]  ?2250 Nodule noted in lung follow-up CT recommended [JK]  ?  ?Clinical Course User Index ?[JK] Dorie Rank, MD  ? ?                        ?Medical Decision Making ?Amount and/or Complexity of Data Reviewed ?Labs: ordered. Decision-making details documented in ED Course. ?Radiology: ordered. ? ? ?Patient presents to the ED with complaints of generalized fatigue.  Some vague chest pain but no other complaints of dyspnea fevers chills abdominal  pain vomiting or diarrhea.  ED work-up overall reassuring.  No signs of urinary tract infection covid and flu are negative.  D-dimer negative doubt pulmonary embolism.  Serial troponins were normal.  Symptoms not suggestive of acute coronary syndrome.  No signs of anemia or electrolyte abnormalities.  Etiology of his fatigue is unclear but no signs of any acute emergency medical condition at this time.  Recommend close follow-up warning signs and precautions ? ? ? ? ? ? ? ?Final Clinical Impression(s) / ED Diagnoses ?Final diagnoses:  ?Fatigue, unspecified type  ?Pulmonary nodule  ? ? ?  Rx / DC Orders ?ED Discharge Orders   ? ? None  ? ?  ? ? ?  ?Dorie Rank, MD ?03/07/22 1627 ? ?

## 2022-03-07 LAB — TSH: TSH: 2.564 u[IU]/mL (ref 0.350–4.500)

## 2022-03-23 ENCOUNTER — Other Ambulatory Visit: Payer: Self-pay

## 2022-03-23 ENCOUNTER — Emergency Department (HOSPITAL_BASED_OUTPATIENT_CLINIC_OR_DEPARTMENT_OTHER)
Admission: EM | Admit: 2022-03-23 | Discharge: 2022-03-23 | Disposition: A | Discharge status: Home / Self Care | Payer: Medicare Other | Attending: Emergency Medicine | Admitting: Emergency Medicine

## 2022-03-23 ENCOUNTER — Encounter (HOSPITAL_BASED_OUTPATIENT_CLINIC_OR_DEPARTMENT_OTHER): Payer: Self-pay

## 2022-03-23 ENCOUNTER — Emergency Department (HOSPITAL_BASED_OUTPATIENT_CLINIC_OR_DEPARTMENT_OTHER): Payer: Medicare Other | Admitting: Radiology

## 2022-03-23 DIAGNOSIS — Z7901 Long term (current) use of anticoagulants: Secondary | ICD-10-CM | POA: Diagnosis not present

## 2022-03-23 DIAGNOSIS — R5383 Other fatigue: Secondary | ICD-10-CM | POA: Diagnosis not present

## 2022-03-23 DIAGNOSIS — R0789 Other chest pain: Secondary | ICD-10-CM | POA: Insufficient documentation

## 2022-03-23 DIAGNOSIS — R42 Dizziness and giddiness: Secondary | ICD-10-CM | POA: Insufficient documentation

## 2022-03-23 DIAGNOSIS — R079 Chest pain, unspecified: Secondary | ICD-10-CM

## 2022-03-23 DIAGNOSIS — R61 Generalized hyperhidrosis: Secondary | ICD-10-CM | POA: Insufficient documentation

## 2022-03-23 DIAGNOSIS — I4891 Unspecified atrial fibrillation: Secondary | ICD-10-CM | POA: Diagnosis not present

## 2022-03-23 LAB — CBC
HCT: 45 % (ref 39.0–52.0)
Hemoglobin: 15.1 g/dL (ref 13.0–17.0)
MCH: 31.3 pg (ref 26.0–34.0)
MCHC: 33.6 g/dL (ref 30.0–36.0)
MCV: 93.2 fL (ref 80.0–100.0)
Platelets: 209 10*3/uL (ref 150–400)
RBC: 4.83 MIL/uL (ref 4.22–5.81)
RDW: 13.6 % (ref 11.5–15.5)
WBC: 6 10*3/uL (ref 4.0–10.5)
nRBC: 0 % (ref 0.0–0.2)

## 2022-03-23 LAB — TROPONIN I (HIGH SENSITIVITY)
Troponin I (High Sensitivity): 5 ng/L (ref <18)
Troponin I (High Sensitivity): 4 ng/L (ref <18)

## 2022-03-23 LAB — BASIC METABOLIC PANEL
Anion gap: 11 (ref 5–15)
BUN: 25 mg/dL — ABNORMAL HIGH (ref 8–23)
CO2: 27 mmol/L (ref 22–32)
Calcium: 9.7 mg/dL (ref 8.9–10.3)
Chloride: 102 mmol/L (ref 98–111)
Creatinine, Ser: 1.01 mg/dL (ref 0.61–1.24)
GFR, Estimated: >60 mL/min (ref >60)
Glucose, Bld: 119 mg/dL — ABNORMAL HIGH (ref 70–99)
Potassium: 4.3 mmol/L (ref 3.5–5.1)
Sodium: 140 mmol/L (ref 135–145)

## 2022-03-23 LAB — MAGNESIUM: Magnesium: 2.2 mg/dL (ref 1.7–2.4)

## 2022-03-23 NOTE — ED Provider Notes (Signed)
?[facility] EMER[facility] DEPT ?Provider Note ? ? ?CSN: [id number] ?Arrival date & time: 03/23/22  1247 ? ?  ? ?History ? ?Chief Complaint  ?Patient presents with  ? Atrial Fibrillation  ? ? ?Matthew Berry is a 76 y.o. male. ? ?76 year old male with past medical history of A-fib, aortic regurgitation presents today for evaluation of episode of diaphoresis, dizziness, chest pressure that occurred around noon today and lasted about 30 minutes.  Patient was evaluated for similar episode back on 3/20.  He states over the past couple months he has had several episodes per week which last between 30 minutes to 90 minutes.  Denies any worsening in episodes.  Denies any lightheadedness, near syncopal episode.  Has not passed out during these episodes.  He states chest pressure is really mild and would not classify it as pain.  He has cardiologist he has been following with in [place] however states they are out of country and they were planning to get their care transition to [facility] and have a pending referral for Dr. Domenic Polite at [facility].  Appointment scheduled for 5/3.  Currently patient is asymptomatic. ? ?The history is provided by the patient. No language interpreter was used.  ? ?  ? ?Home Medications ?Prior to Admission medications   ?Medication Sig Start Date End Date Taking? Authorizing Provider  ?ELIQUIS 5 MG TABS tablet Take 5 mg by mouth 2 (two) times daily. 03/12/22   [provider]  ?HYDROcodone-acetaminophen (NORCO/VICODIN) 5-325 MG per tablet Take 1-2 tablets by mouth every 4 (four) hours as needed. 11/22/12   Jovita Gamma, MD  ?ibuprofen (ADVIL,MOTRIN) 800 MG tablet Take 800 mg by mouth every 8 (eight) hours as needed. Pain.    [provider]  ?Lido-Capsaicin-Men-Methyl Sal (MEDI-PATCH-LIDOCAINE) 0.5-0.035-5-20 % PTCH Apply 1 patch topically as needed. On 12h off 12h    [provider]  ?metoprolol tartrate (LOPRESSOR) 25 MG tablet Take 25 mg by mouth 2 (two) times daily.  03/05/22   [provider]  ?Multiple Vitamin (MULTIVITAMIN WITH MINERALS) TABS Take 1 tablet by mouth daily.    [provider]  ?naproxen sodium (ANAPROX) 220 MG tablet Take 440 mg by mouth 2 (two) times daily as needed. pain    [provider]  ?   ? ?Allergies    ?Sulfa antibiotics   ? ?Review of Systems   ?Review of Systems  ?Constitutional:  Positive for diaphoresis. Negative for activity change, appetite change, chills and fever.  ?Respiratory:  Negative for shortness of breath.   ?Cardiovascular:  Negative for chest pain, palpitations and leg swelling.  ?Gastrointestinal:  Negative for abdominal pain, nausea and vomiting.  ?Musculoskeletal:  Negative for gait problem.  ?Neurological:  Positive for dizziness. Negative for syncope, weakness and light-headedness.  ?All other systems reviewed and are negative. ? ?Physical Exam ?Updated Vital Signs ?BP 113/72   Pulse (!) 59   Temp 97.6 ?F (36.4 ?C)   Resp 17   Ht 6' (1.829 m)   Wt 83.6 kg   SpO2 95%   BMI 25.00 kg/m?  ?Physical Exam ?Vitals and nursing note reviewed.  ?Constitutional:   ?   General: He is not in acute distress. ?   Appearance: Normal appearance. He is not ill-appearing.  ?HENT:  ?   Head: Normocephalic and atraumatic.  ?   Nose: Nose normal.  ?Eyes:  ?   General: No scleral icterus. ?   Extraocular Movements: Extraocular movements intact.  ?   Conjunctiva/sclera: Conjunctivae normal.  ?  Cardiovascular:  ?   Rate and Rhythm: Normal rate. Rhythm irregular.  ?   Pulses: Normal pulses.  ?Pulmonary:  ?   Effort: Pulmonary effort is normal. No respiratory distress.  ?   Breath sounds: Normal breath sounds. No wheezing or rales.  ?Abdominal:  ?   General: There is no distension.  ?   Palpations: Abdomen is soft.  ?   Tenderness: There is no abdominal tenderness. There is no guarding.  ?Musculoskeletal:     ?   General: Normal range of motion.  ?   Cervical back: Normal range of motion.  ?   Right lower leg: No edema.  ?    Left lower leg: No edema.  ?Skin: ?   General: Skin is warm and dry.  ?Neurological:  ?   General: No focal deficit present.  ?   Mental Status: He is alert. Mental status is at baseline.  ? ? ?ED Results / Procedures / Treatments   ?Labs ?(all labs ordered are listed, but only abnormal results are displayed) ?Labs Reviewed  ?BASIC METABOLIC PANEL - Abnormal; Notable for the following components:  ?    Result Value  ? Glucose, Bld 119 (*)   ? BUN 25 (*)   ? All other components within normal limits  ?MAGNESIUM  ?CBC  ?TROPONIN I (HIGH SENSITIVITY)  ?TROPONIN I (HIGH SENSITIVITY)  ? ? ?EKG ?EKG Interpretation ? ?Date/Time:  Thursday March 23 2022 13:10:20 EDT ?Ventricular Rate:  61 ?PR Interval:    ?QRS Duration: 102 ?QT Interval:  418 ?QTC Calculation: 420 ?R Axis:   8 ?Text Interpretation: Atrial fibrillation with premature ventricular or aberrantly conducted complexes Possible Anterior infarct (cited on or before 06-Mar-2022) Abnormal ECG When compared with ECG of 06-Mar-2022 17:18, Questionable change in initial forces of Septal leads T wave inversion no longer evident in Inferior leads Confirmed by Isla Pence [phone]) on 03/23/2022 2:09:12 PM ? ?Radiology ?DG Chest 2 View ? ?Result Date: 03/23/2022 ?CLINICAL DATA:  Chest pain, fatigue and weakness. EXAM: CHEST - 2 VIEW COMPARISON:  03/06/2022 FINDINGS: The heart size and mediastinal contours are within normal limits. Mild chronic pulmonary interstitial prominence. There is no evidence of pulmonary edema, consolidation, pneumothorax, nodule or pleural fluid. The visualized skeletal structures are unremarkable. IMPRESSION: Mild chronic pulmonary interstitial prominence. Electronically Signed   By: Aletta Edouard M.D.   On: 03/23/2022 14:42   ? ?Procedures ?Procedures  ? ? ?Medications Ordered in ED ?Medications - No data to display ? ?ED Course/ Medical Decision Making/ A&P ?  ?                        ?Medical Decision Making ?Amount and/or Complexity of Data  Reviewed ?Labs: ordered. ?Radiology: ordered. ? ? ?Medical Decision Making / ED Course ? ? ?This patient presents to the ED for concern of dizziness, diaphoresis chest pressure, this involves an extensive number of treatment options, and is a complaint that carries with it a high risk of complications and morbidity.  The differential diagnosis includes ACS, A-fib RVR, CHF, pneumonia ? ?MDM: ?76 year old male with past medical history of A-fib, aortic regurgitation presents today for evaluation of recurrent episodes of diaphoresis, chest pressure, dizziness.  He states sometimes these occur after exertion other times at rest.  These last between 30 to 90 minutes.  Denies palpitations during these episodes.  Does not have history of CAD.  Currently he is asymptomatic.  He was previously seen on 3/20  for similar symptoms.  Has an upcoming appointment with cardiology on 5/3.  CBC without leukocytosis or anemia.  BMP unremarkable with exception of glucose of 119, BUN of 25.  Troponin of 5, magnesium 2.2.  Chest x-ray without acute findings.  EKG without acute ischemic changes.  Given he is without chest pain currently and does not have a history of CAD unlikely to be ACS.  Given he is in persistent A-fib and he is rate controlled does not require intervention from the standpoint.  He is on Eliquis and Lopressor.  Reports compliance with his medications.  Patient ambulated around the department without recurrent symptoms.  Case discussed with cardiology to see if he requires any additional work-up prior to his follow-up appointment.  They do not recommend additional work-up.  This was discussed with patient.  They voiced understanding and are in agreement with plan.  Return precautions discussed.  Discussed importance of calling cardiology for follow-up. ?Patient able to ambulate without difficulty.  Reported mild fatigue otherwise denied lightheadedness, palpitations, chest pressure, or diaphoresis.  Patient is  appropriate for discharge.  Discharged in stable condition.  Return precautions discussed.  Patient voices understanding and is in agreement with plan. ? ? ?Additional history obtained: ?-Additional history obtained f

## 2022-03-23 NOTE — Discharge Instructions (Signed)
Your work-up today was reassuring and did not show anything concerning.  You were in A-fib during today's visit however this is not new.  Looking at your previous records you were in A-fib when you presented to the emergency room on 3/20, and your previous cardiology visit in January.  He denies palpitations or your heart racing when you are up and moving around.  I also discussed your case with cardiologist from Dr. Myles Gip group who does not recommend any additional work-up until you see Dr. Domenic Polite in the clinic.  If you have any worsening symptoms until your follow-up such as chest pain, worsening shortness of breath, lightheadedness or feeling like you are going to pass out, or your heart racing please return to the emergency room for evaluation. ?

## 2022-03-23 NOTE — ED Notes (Signed)
Patient verbalizes understanding of discharge instructions. Opportunity for questioning and answers were provided. Patient discharged from ED.  °

## 2022-03-23 NOTE — ED Notes (Signed)
Ambulated patient in hallway. Tolerated well but said he felt exhausted. HR  59  Oxygen  96 ?

## 2022-03-23 NOTE — ED Triage Notes (Addendum)
Pt states he has a hx of atrial fibrillation, states he has felt dizzy and "wiped out".   ?Denies chest pain although he took a nitroglycerin PTA ?Has appt with [facility] cardiology 04/19/2022 ?Pt is on Eliquis and Lopressor ? ?

## 2022-03-28 DIAGNOSIS — G5601 Carpal tunnel syndrome, right upper limb: Secondary | ICD-10-CM | POA: Insufficient documentation

## 2022-04-18 ENCOUNTER — Encounter: Payer: Self-pay | Admitting: Cardiology

## 2022-04-18 NOTE — Progress Notes (Signed)
? ? ?Cardiology Office Note ? ?Date: 04/19/2022  ? ?ID: Matthew Berry, DOB 1946/02/22, MRN [id number] ? ?PCP:  Olena Mater, MD  ?Cardiologist:  Rozann Lesches, MD ?Electrophysiologist:  None  ? ?Chief Complaint  ?Patient presents with  ? Establish cardiology follow-up  ? ? ?History of Present Illness: ?Matthew Berry is a 76 y.o. male referred for cardiology consultation by Dr. Ginette Otto with diagnoses of atrial fibrillation and aortic stenosis.  He was previously followed through the cardiology division of [facility].  He is here today with his wife. ? ?He states that he was diagnosed with atrial fibrillation approximately 9 years ago.  It sounds like he had paroxysmal atrial fibrillation which ultimately persisted, and more recently has been managed as permanent atrial fibrillation with heart rate control.  CHA2DS2-VASc score is 2 and he is on Eliquis for stroke prophylaxis.  He does not report any spontaneous bleeding problems.  I reviewed his recent lab work. ? ?In terms of symptoms he does not describe any palpitations.  Does have episodes of fatigue, also unsteadiness sometimes.  No falls.  This has been noticeable over the last few months in particular. ? ?I personally reviewed his ECG today which shows atrial fibrillation at 64 bpm.  He is on Lopressor 25 mg twice daily. ? ?Recently underwent sleep study per PCP, results pending. ? ?His last echocardiogram was in 2021 through the [facility] system at which point LVEF was 55 to 60% and he had moderately sclerotic aortic valve with mild to moderate aortic regurgitation. ? ?He is retired from the [employer]. ? ?Past Medical History:  ?Diagnosis Date  ? Actinic keratosis   ? Aortic valve disease   ? Arthritis   ? Atrial fibrillation ([place])   ? Erectile dysfunction   ? Nephrolithiasis   ? Peripheral neuropathy   ? Skin cancer   ? ? ?Past Surgical History:  ?Procedure Laterality Date  ? BACK SURGERY  12/18/1974  ? disc surgery   ? CARPAL TUNNEL RELEASE  2009,  2010  ? bilat  ? CATARACT EXTRACTION    ? KIDNEY STONE SURGERY    ? LUMBAR LAMINECTOMY/DECOMPRESSION MICRODISCECTOMY  11/21/2012  ? Procedure: LUMBAR LAMINECTOMY/DECOMPRESSION MICRODISCECTOMY 2 LEVELS;  Surgeon: Hosie Spangle, MD;  Location: [facility];  Service: Neurosurgery;  Laterality: N/A;  Lumbar two to lumbar four laminectomies  ? ? ?Current Outpatient Medications  ?Medication Sig Dispense Refill  ? Cholecalciferol 125 MCG (5000 UT) capsule Take 1 tablet by mouth daily.    ? ELIQUIS 5 MG TABS tablet Take 5 mg by mouth 2 (two) times daily.    ? Misc Natural Products (NARCOSOFT HERBAL LAX PO) Take 1 tablet by mouth daily.    ? Multiple Vitamin (MULTIVITAMIN WITH MINERALS) TABS Take 1 tablet by mouth daily.    ? Multiple Vitamin (MULTIVITAMIN) tablet Take 1 tablet by mouth daily.    ? Multiple Vitamins-Minerals (PRESERVISION AREDS 2) CHEW Chew 1 tablet by mouth in the morning and at bedtime.    ? Omega-3 Fatty Acids (FISH OIL) 1000 MG CAPS Take 1 capsule by mouth in the morning and at bedtime.    ? vitamin C (ASCORBIC ACID) 500 MG tablet Take 1 tablet by mouth daily.    ? Zinc 25 MG TABS Take 1 tablet by mouth daily.    ? metoprolol tartrate (LOPRESSOR) 25 MG tablet Take 0.5 tablets (12.5 mg total) by mouth 2 (two) times daily. 60 tablet 2  ? ?No current facility-administered medications for  this visit.  ? ?Allergies:  Sulfa antibiotics  ? ?Social History: The patient  reports that he quit smoking about 48 years ago. His smoking use included cigarettes. He does not have any smokeless tobacco history on file. He reports that he does not drink alcohol and does not use drugs.  ? ?Family History: The patient's family history includes Asthma in his sister; Breast cancer in his mother; Diabetes in his father; Heart failure in his mother; Stroke in his father.  ? ?ROS: No orthopnea or PND.  Daytime somnolence. ? ?Physical Exam: ?VS:  BP 106/68   Pulse 66   Ht '5\' 11"'$  (1.803 m)   Wt 178 lb 9.6 oz (81 kg)   SpO2  96%   BMI 24.91 kg/m? , BMI Body mass index is 24.91 kg/m?. ? ?Wt Readings from Last 3 Encounters:  ?04/19/22 178 lb 9.6 oz (81 kg)  ?03/23/22 184 lb 4.9 oz (83.6 kg)  ?03/06/22 184 lb 4.9 oz (83.6 kg)  ?  ?General: Patient appears comfortable at rest. ?HEENT: Conjunctiva and lids normal, oropharynx clear. ?Neck: Supple, no elevated JVP or carotid bruits, no thyromegaly. ?Lungs: Clear to auscultation, nonlabored breathing at rest. ?Cardiac: Irregularly irregular, no S3, 1/6 systolic murmur, no pericardial rub. ?Abdomen: Soft, nontender, bowel sounds present. ?Extremities: No pitting edema, distal pulses 2+. ?Skin: Warm and dry. ?Musculoskeletal: No kyphosis. ?Neuropsychiatric: Alert and oriented x3, affect grossly appropriate. ? ?ECG:  An ECG dated 03/23/2022 was personally reviewed today and demonstrated:  Atrial fibrillation with nonspecific ST changes and decreased R wave progression. ? ?Recent Labwork: ?03/06/2022: TSH 2.564 ?03/23/2022: BUN 25; Creatinine, Ser 1.01; Hemoglobin 15.1; Magnesium 2.2; Platelets 209; Potassium 4.3; Sodium 140  ? ?Other Studies Reviewed Today: ? ?Echocardiogram 07/20/2020 [facility]): ?Summary  ? 1. Overall left ventricular ejection fraction is estimated at 55 to 60%.  ? 2. Moderately dilated left atrium.  ? 3. Mild mitral valve regurgitation.  ? 4. Mild to moderate aortic regurgitation.  ? 5. There is moderate aortic valve sclerosis.  ? ?Lexiscan Myoview 09/28/2021 [facility]): ?Normal pharmacologic stress myocardial perfusion study.  ?1.  No perfusion defect indicating ischemia or infarction.  (There appears  ?to be subdiaphragmatic attenuation artifact)  ?2. Transient ischemic dilatation (TID): 1 (normal)  ?3.  Low normal left ventricular systolic function; LVEF : 51%, SV : 51 mL. ?4.  No significant ischemic EKG changes with stress.  Atrial fibrillation  ?at rest.  ?5.  Low risk study for ischemia.    ?6.  When compared to prior stress MPS report on 12/25/2013 there is no  ?significant  change.  ?7.  No significant extracardiac uptake  ? ?Assessment and Plan: ? ?1.  Permanent atrial fibrillation, heart rate in the 60s today on Lopressor 25 mg twice daily.  CHA2DS2-VASc score is 2 and he is on Eliquis.  I reviewed his recent lab work.  Given his reported episodes of fatigue, weakness, and unsteadiness we will obtain a 7-day Zio patch to better investigate heart rate variability.  For now I have asked him to cut his Lopressor to 12.5 mg twice daily.  May need to discontinue it altogether depending on heart rate trend. ? ?2.  Aortic valve disease, at least sclerotic aortic valve, possibly mild aortic stenosis based on record review, also mild to moderate aortic regurgitation.  He is asymptomatic.  Plan baseline echocardiogram to establish follow-up going forward. ? ?Medication Adjustments/Labs and Tests Ordered: ?Current medicines are reviewed at length with the patient today.  Concerns regarding medicines  are outlined above.  ? ?Tests Ordered: ?Orders Placed This Encounter  ?Procedures  ? EKG 12-Lead  ? ECHOCARDIOGRAM COMPLETE  ? ? ?Medication Changes: ?Meds ordered this encounter  ?Medications  ? metoprolol tartrate (LOPRESSOR) 25 MG tablet  ?  Sig: Take 0.5 tablets (12.5 mg total) by mouth 2 (two) times daily.  ?  Dispense:  60 tablet  ?  Refill:  2  ?  04/19/2022 dose decrease  ? ? ?Disposition:  Follow up  3 months. ? ?Signed, ?Satira Sark, MD, [facility] ?04/19/2022 9:40 AM    ?[facility] ?[place], [place], [place] [address] ?Phone: [phone]; Fax: [phone]  ?

## 2022-04-19 ENCOUNTER — Encounter: Payer: Self-pay | Admitting: Cardiology

## 2022-04-19 ENCOUNTER — Ambulatory Visit (INDEPENDENT_AMBULATORY_CARE_PROVIDER_SITE_OTHER): Payer: Medicare Other | Admitting: Cardiology

## 2022-04-19 ENCOUNTER — Ambulatory Visit (INDEPENDENT_AMBULATORY_CARE_PROVIDER_SITE_OTHER): Payer: Medicare Other

## 2022-04-19 ENCOUNTER — Other Ambulatory Visit: Payer: Self-pay | Admitting: Cardiology

## 2022-04-19 VITALS — BP 106/68 | HR 66 | Ht 71.0 in | Wt 178.6 lb

## 2022-04-19 DIAGNOSIS — I480 Paroxysmal atrial fibrillation: Secondary | ICD-10-CM | POA: Diagnosis not present

## 2022-04-19 DIAGNOSIS — R001 Bradycardia, unspecified: Secondary | ICD-10-CM | POA: Diagnosis not present

## 2022-04-19 DIAGNOSIS — R531 Weakness: Secondary | ICD-10-CM | POA: Diagnosis not present

## 2022-04-19 DIAGNOSIS — I4821 Permanent atrial fibrillation: Secondary | ICD-10-CM | POA: Diagnosis not present

## 2022-04-19 DIAGNOSIS — I350 Nonrheumatic aortic (valve) stenosis: Secondary | ICD-10-CM

## 2022-04-19 MED ORDER — METOPROLOL TARTRATE 25 MG PO TABS: 12.5000 mg | ORAL_TABLET | Freq: Two times a day (BID) | ORAL | 2 refills | Status: DC

## 2022-04-19 NOTE — Patient Instructions (Addendum)
Medication Instructions:  ?Your physician has recommended you make the following change in your medication:  ?Decrease metoprolol tartrate to 12.5 mg twice daily ?Continue other medications the same ? ?Labwork: ?none ? ?Testing/Procedures: ?Your physician has requested that you have an echocardiogram. Echocardiography is a painless test that uses sound waves to create images of your heart. It provides your doctor with information about the size and shape of your heart and how well your heart?s chambers and valves are working. This procedure takes approximately one hour. There are no restrictions for this procedure. ?Your physician has recommended that you wear a Zio monitor.  ? ?This monitor is a medical device that records the heart?s electrical activity. Doctors most often use these monitors to diagnose arrhythmias. Arrhythmias are problems with the speed or rhythm of the heartbeat. The monitor is a small device applied to your chest. You can wear one while you do your normal daily activities. While wearing this monitor if you have any symptoms to push the button and record what you felt. Once you have worn this monitor for the period of time provider prescribed (for 7 days), you will return the monitor device in the postage paid box. Once it is returned they will download the data collected and provide [place] with a report which the provider will then review and we will call you with those results. Important tips: ? ?Avoid showering during the first 24 hours of wearing the monitor. ?Avoid excessive sweating to help maximize wear time. ?Do not submerge the device, no hot tubs, and no swimming pools. ?Keep any lotions or oils away from the patch. ?After 24 hours you may shower with the patch on. Take brief showers with your back facing the shower head.  ?Do not remove patch once it has been placed because that will interrupt data and decrease adhesive wear time. ?Push the button when you have any symptoms and write  down what you were feeling. ?Once you have completed wearing your monitor, remove and place into box which has postage paid and place in your outgoing mailbox.  ?If for some reason you have misplaced your box then call our office and we can provide another box and/or mail it off for you. ? ?Follow-Up: ?Your physician recommends that you schedule a follow-up appointment in: 3 months ? ?Any Other Special Instructions Will Be Listed Below (If Applicable). ? ?If you need a refill on your cardiac medications before your next appointment, please call your pharmacy. ?

## 2022-05-02 ENCOUNTER — Telehealth: Payer: Self-pay | Admitting: Cardiology

## 2022-05-02 MED ORDER — ELIQUIS 5 MG PO TABS: 5.0000 mg | ORAL_TABLET | Freq: Two times a day (BID) | ORAL | 5 refills | Status: DC

## 2022-05-02 NOTE — Telephone Encounter (Signed)
?*  STAT* If patient is at the pharmacy, call can be transferred to refill team. ? ? ?1. Which medications need to be refilled? (please list name of each medication and dose if known)  ?ELIQUIS 5 MG TABS tablet ? ?2. Which pharmacy/location (including street and city if local pharmacy) is medication to be sent to? ?[id number] [place]? ?3. Do they need a 30 day or 90 day supply? 30 with refills  ? ? ?Patient said that the previous Cardiologist wrote this rx but there are no refills left  ?

## 2022-05-02 NOTE — Telephone Encounter (Signed)
Prescription refill request for Eliquis received. ?Indication: Atrial Fib ?Last office visit: 04/19/22  Myles Gip MD ?Scr: 0.99 on 04/27/22 ?Age:  76 ?Weight: 81kg ? ?Based on above findings Eliquis '5mg'$  twice daily is the appropriate dose.  Refill approved. ? ?

## 2022-05-04 ENCOUNTER — Telehealth: Payer: Self-pay | Admitting: Cardiology

## 2022-05-04 NOTE — Telephone Encounter (Signed)
Patient is returning call to discuss monitor results. 

## 2022-05-04 NOTE — Telephone Encounter (Signed)
-----   Message from Satira Sark, MD sent at 05/02/2022  8:14 AM EDT ----- Results reviewed.  Cardiac monitor shows average heart rate in atrial fibrillation of 72 bpm.  There are slower rates although these are typically nocturnal, including some pauses around 3 seconds.  If he is feeling better since we cut back Lopressor, would continue with current plan.  If he is still having episodic unsteadiness are dizziness, I would suggest stopping Lopressor altogether.

## 2022-05-04 NOTE — Telephone Encounter (Signed)
Patient informed and verbalized understanding of plan. Reports dizziness has continued with lower lopressor dose and will stop lopressor as suggested.

## 2022-05-09 ENCOUNTER — Ambulatory Visit (INDEPENDENT_AMBULATORY_CARE_PROVIDER_SITE_OTHER): Payer: Medicare Other

## 2022-05-09 DIAGNOSIS — I350 Nonrheumatic aortic (valve) stenosis: Secondary | ICD-10-CM

## 2022-05-09 LAB — ECHOCARDIOGRAM COMPLETE
AR max vel: 1.62 cm2
AV Area VTI: 1.71 cm2
AV Area mean vel: 1.58 cm2
AV Mean grad: 10 mmHg
AV Peak grad: 17.3 mmHg
AV Vena cont: 0.44 cm
Ao pk vel: 2.08 m/s
Area-P 1/2: 5.23 cm2
Calc EF: 60.2 %
MV M vel: 3.16 m/s
MV Peak grad: 39.9 mmHg
P 1/2 time: 1179 msec
S' Lateral: 2.62 cm
Single Plane A2C EF: 60.6 %
Single Plane A4C EF: 60.7 %

## 2022-05-19 ENCOUNTER — Encounter: Payer: Self-pay | Admitting: Cardiology

## 2022-08-09 NOTE — Progress Notes (Unsigned)
Cardiology Office Note  Date: 08/10/2022   ID: Matthew Berry, Matthew Berry [birth year], MRN [id number]  PCP:  Olena Mater, MD  Cardiologist:  Rozann Lesches, MD Electrophysiologist:  None   Chief Complaint  Patient presents with   Cardiac follow-up    History of Present Illness: ESVIN HNAT is a 76 y.o. male last seen in May.  He is here today with his wife for a follow-up visit.  States that he feels better, Lopressor has been discontinued completely since our last encounter due to suspected symptomatic bradycardia.  His heart rate is in the 60s in atrial fibrillation today.  He and his wife took a cruise to [place] for the [employer], enjoyed this very much.  I reviewed the remainder of his medications, he continues on Eliquis for stroke prophylaxis and does not report any spontaneous bleeding problems.  We did discuss his echocardiogram from May as reviewed below.  We will continue to follow aortic stenosis, most recently mild range with mild to moderate aortic regurgitation.  Past Medical History:  Diagnosis Date   Actinic keratosis    Aortic valve disease    Arthritis    Atrial fibrillation [facility])    Erectile dysfunction    Nephrolithiasis    Peripheral neuropathy    Skin cancer     Past Surgical History:  Procedure Laterality Date   BACK SURGERY  12/18/1974   disc surgery    CARPAL TUNNEL RELEASE  2009, 2010   bilat   CATARACT EXTRACTION     KIDNEY STONE SURGERY     LUMBAR LAMINECTOMY/DECOMPRESSION MICRODISCECTOMY  11/21/2012   Procedure: LUMBAR LAMINECTOMY/DECOMPRESSION MICRODISCECTOMY 2 LEVELS;  Surgeon: Hosie Spangle, MD;  Location: [facility];  Service: Neurosurgery;  Laterality: N/A;  Lumbar two to lumbar four laminectomies    Current Outpatient Medications  Medication Sig Dispense Refill   Cholecalciferol 125 MCG (5000 UT) capsule Take 1 tablet by mouth daily.     ELIQUIS 5 MG TABS tablet Take 1 tablet (5 mg total) by mouth 2 (two) times  daily. 60 tablet 5   Misc Natural Products (NARCOSOFT HERBAL LAX PO) Take 1 tablet by mouth daily.     Multiple Vitamin (MULTIVITAMIN) tablet Take 1 tablet by mouth daily.     Multiple Vitamins-Minerals (PRESERVISION AREDS 2) CHEW Chew 1 tablet by mouth in the morning and at bedtime.     Omega-3 Fatty Acids (FISH OIL) 1000 MG CAPS Take 1 capsule by mouth in the morning and at bedtime.     vitamin C (ASCORBIC ACID) 500 MG tablet Take 1 tablet by mouth daily.     Zinc 25 MG TABS Take 1 tablet by mouth daily.     No current facility-administered medications for this visit.   Allergies:  Sulfa antibiotics   ROS: No syncope.  Physical Exam: VS:  BP 116/70   Pulse 65   Ht '5\' 11"'$  (1.803 m)   Wt 177 lb 12.8 oz (80.6 kg)   SpO2 94%   BMI 24.80 kg/m , BMI Body mass index is 24.8 kg/m.  Wt Readings from Last 3 Encounters:  08/10/22 177 lb 12.8 oz (80.6 kg)  04/19/22 178 lb 9.6 oz (81 kg)  03/23/22 184 lb 4.9 oz (83.6 kg)    General: Patient appears comfortable at rest. HEENT: Conjunctiva and lids normal. Lungs: Clear to auscultation, nonlabored breathing at rest. Cardiac: Irregularly irregular, no S3, 7-8/2 systolic murmur, no pericardial rub.  ECG:  An ECG dated 04/19/2022  was personally reviewed today and demonstrated:  Atrial fibrillation.  Recent Labwork: 03/06/2022: TSH 2.564 03/23/2022: BUN 25; Creatinine, Ser 1.01; Hemoglobin 15.1; Magnesium 2.2; Platelets 209; Potassium 4.3; Sodium 140   Other Studies Reviewed Today:  Cardiac monitor May 2023: ZIO XT reviewed.  6 days, 22 hours analyzed.  Predominant rhythm is atrial fibrillation with heart rate ranging from 37 bpm up to 147 bpm and average heart rate 72 bpm.  There were rare PVCs including ventricular couplets representing less than 1% total beats.  Also brief episode of ventricular trigeminy.  There were three pauses noted, the longest of which was 3.2 seconds and occurred at 4:57 AM, presumably while asleep and was not a patient  triggered event.  The other two pauses were around 3 seconds in duration, one occurred at 1:55 AM and the other at 2:56 PM.  Neither of these were patient triggered events and presumably asymptomatic.  Slowest recorded heart rate was at 3:16 AM.  Echocardiogram 05/09/2022:  1. Left ventricular ejection fraction, by estimation, is 60 to 65%. The  left ventricle has normal function. The left ventricle has no regional  wall motion abnormalities. Left ventricular diastolic parameters are  indeterminate. The average left  ventricular global longitudinal strain is -20.9 %. The global longitudinal  strain is normal.   2. Right ventricular systolic function is normal. The right ventricular  size is normal. There is normal pulmonary artery systolic pressure.   3. The mitral valve is normal in structure. Trivial mitral valve  regurgitation. No evidence of mitral stenosis.   4. The tricuspid valve is abnormal.   5. The aortic valve has an indeterminant number of cusps. There is mild  calcification of the aortic valve. There is mild thickening of the aortic  valve. Aortic valve regurgitation is mild to moderate. Mild aortic valve  stenosis. Aortic valve mean  gradient measures 10.0 mmHg. Aortic valve peak gradient measures 17.3  mmHg. Aortic valve area, by VTI measures 1.71 cm.   6. The inferior vena cava is normal in size with greater than 50%  respiratory variability, suggesting right atrial pressure of 3 mmHg.   Assessment and Plan:  1.  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  We will continue to follow in case further intervention is necessary ultimately.  Continue Eliquis for stroke prophylaxis.  2.  Aortic valve disease.  Follow-up echocardiogram in May showed mild aortic stenosis with mean gradient 10 mmHg, also mild to moderate aortic regurgitation.  No  significant change in murmur, unlikely to be symptomatic at this point.  Continue to follow.  Medication Adjustments/Labs and Tests Ordered: Current medicines are reviewed at length with the patient today.  Concerns regarding medicines are outlined above.   Tests Ordered: No orders of the defined types were placed in this encounter.   Medication Changes: No orders of the defined types were placed in this encounter.   Disposition:  Follow up  6 months.  Signed, Satira Sark, MD, [facility] 08/10/2022 9:55 AM    [facility] [place], [place], [place] [address] Phone: [phone]; Fax: [phone]

## 2022-08-10 ENCOUNTER — Encounter: Payer: Self-pay | Admitting: Cardiology

## 2022-08-10 ENCOUNTER — Ambulatory Visit (INDEPENDENT_AMBULATORY_CARE_PROVIDER_SITE_OTHER): Payer: Medicare Other | Admitting: Cardiology

## 2022-08-10 VITALS — BP 116/70 | HR 65 | Ht 71.0 in | Wt 177.8 lb

## 2022-08-10 DIAGNOSIS — I4821 Permanent atrial fibrillation: Secondary | ICD-10-CM | POA: Diagnosis not present

## 2022-08-10 DIAGNOSIS — I350 Nonrheumatic aortic (valve) stenosis: Secondary | ICD-10-CM | POA: Diagnosis not present

## 2022-08-10 NOTE — Patient Instructions (Signed)

## 2022-11-07 ENCOUNTER — Telehealth: Payer: Self-pay | Admitting: *Deleted

## 2022-11-07 NOTE — Telephone Encounter (Signed)
Pt has been scheduled for tele pre op appt 12/01/22 @ 2 pm. Med rec and consent are done.     Patient Consent for Virtual Visit        Matthew Berry has provided verbal consent on 11/07/2022 for a virtual visit (video or telephone).   CONSENT FOR VIRTUAL VISIT FOR:  Matthew Berry  By participating in this virtual visit I agree to the following:  I hereby voluntarily request, consent and authorize [facility] and its employed or contracted physicians, physician assistants, nurse practitioners or other licensed health care professionals (the Practitioner), to provide me with telemedicine health care services (the "Services") as deemed necessary by the treating Practitioner. I acknowledge and consent to receive the Services by the Practitioner via telemedicine. I understand that the telemedicine visit will involve communicating with the Practitioner through live audiovisual communication technology and the disclosure of certain medical information by electronic transmission. I acknowledge that I have been given the opportunity to request an in-person assessment or other available alternative prior to the telemedicine visit and am voluntarily participating in the telemedicine visit.  I understand that I have the right to withhold or withdraw my consent to the use of telemedicine in the course of my care at any time, without affecting my right to future care or treatment, and that the Practitioner or I may terminate the telemedicine visit at any time. I understand that I have the right to inspect all information obtained and/or recorded in the course of the telemedicine visit and may receive copies of available information for a reasonable fee.  I understand that some of the potential risks of receiving the Services via telemedicine include:  Delay or interruption in medical evaluation due to technological equipment failure or disruption; Information transmitted may not be sufficient (e.g.  poor resolution of images) to allow for appropriate medical decision making by the Practitioner; and/or  In rare instances, security protocols could fail, causing a breach of personal health information.  Furthermore, I acknowledge that it is my responsibility to provide information about my medical history, conditions and care that is complete and accurate to the best of my ability. I acknowledge that Practitioner's advice, recommendations, and/or decision may be based on factors not within their control, such as incomplete or inaccurate data provided by me or distortions of diagnostic images or specimens that may result from electronic transmissions. I understand that the practice of medicine is not an exact science and that Practitioner makes no warranties or guarantees regarding treatment outcomes. I acknowledge that a copy of this consent can be made available to me via my patient portal ([facility]), or I can request a printed copy by calling the office of [facility].    I understand that my insurance will be billed for this visit.   I have read or had this consent read to me. I understand the contents of this consent, which adequately explains the benefits and risks of the Services being provided via telemedicine.  I have been provided ample opportunity to ask questions regarding this consent and the Services and have had my questions answered to my satisfaction. I give my informed consent for the services to be provided through the use of telemedicine in my medical care

## 2022-11-07 NOTE — Telephone Encounter (Signed)
   Name: Matthew Berry  DOB: September 11, 1946  MRN: [id number]  Primary Cardiologist: Rozann Lesches, MD   Preoperative team, please contact this patient and set up a phone call appointment for further preoperative risk assessment. Please obtain consent and complete medication review. Thank you for your help.  I confirm that guidance regarding antiplatelet and oral anticoagulation therapy has been completed and, if necessary, noted below.  Pharmacy has provided recommendations for holding anticoagulation.   Deberah Pelton, NP 11/07/2022, 1:42 PM [place]

## 2022-11-07 NOTE — Telephone Encounter (Signed)
Patient with diagnosis of afib on Eliquis for anticoagulation.    Procedure: right carpal tunnel release revision Date of procedure: 12/13/22  CHA2DS2-VASc Score = 2  This indicates a 2.2% annual risk of stroke. The patient's score is based upon: CHF History: 0 HTN History: 0 Diabetes History: 0 Stroke History: 0 Vascular Disease History: 0 Age Score: 2 Gender Score: 0   CrCl 52m/min Platelet count 195K  Per office protocol, patient can hold Eliquis for 1-2 days prior to procedure.    **This guidance is not considered finalized until pre-operative APP has relayed final recommendations.**

## 2022-11-07 NOTE — Telephone Encounter (Signed)
   Pre-operative Risk Assessment    Patient Name: Matthew Berry  DOB: [birth year] MRN: [id number]      Request for Surgical Clearance    Procedure:   RIGHT CARPAL TUNNEL RELEASE REVISION  Date of Surgery:  Clearance 12/13/22                                 Surgeon:  DR. FRED [facility] Surgeon's Group or Practice Name:  [facility] Phone number:  [phone] Fax number:  [phone] ATTN: MEGAN DAVIS   Type of Clearance Requested:   - Medical  - Pharmacy:  Hold Apixaban (Eliquis)     Type of Anesthesia:   CHOICE   Additional requests/questions:    [name]   11/07/2022, 11:20 AM

## 2022-11-07 NOTE — Telephone Encounter (Signed)
Pt has been scheduled for tele pre op appt 12/01/22 @ 2 pm. Med rec and consent are done.

## 2022-11-30 NOTE — Progress Notes (Signed)
Virtual Visit via Telephone Note   Because of Matthew Berry's co-morbid illnesses, he is at least at moderate risk for complications without adequate follow up.  This format is felt to be most appropriate for this patient at this time.  The patient did not have access to video technology/had technical difficulties with video requiring transitioning to audio format only (telephone).  All issues noted in this document were discussed and addressed.  No physical exam could be performed with this format.  Please refer to the patient's chart for his consent to telehealth for Matthew Berry.  Evaluation Performed:  Preoperative cardiovascular risk assessment _____________   Date:  11/30/2022   Patient ID:  Matthew Berry, DOB 01/02/1946, MRN [id number] Patient Location:  Home Provider location:   Office  Primary Care Provider:  Olena Mater, MD Primary Cardiologist:  Rozann Lesches, MD  Chief Complaint / Patient Profile   76 y.o. y/o male with a h/o permanent atrial fibrillation on chronic anticoagulation, mild aortic stenosis, mild to moderate aortic regurgitation who is pending right carpal tunnel release revision and presents today for telephonic preoperative cardiovascular risk assessment.  History of Present Illness    Matthew Berry is a 76 y.o. male who presents via audio/video conferencing for a telehealth visit today.  Pt was last seen in cardiology clinic on 08/10/22 by Dr. Domenic Polite.  At that time Matthew Berry was doing well.  The patient is now pending procedure as outlined above. Since his last visit, he  denies chest pain, shortness of breath, lower extremity edema, fatigue, palpitations, melena, hematuria, hemoptysis, diaphoresis, weakness, presyncope, syncope, orthopnea, and PND. He is active at home and at church and has no activity limitations, easily achieves > 4 METS activity without cardiac symptoms.   Past Medical History    Past Medical History:   Diagnosis Date   Actinic keratosis    Aortic valve disease    Arthritis    Atrial fibrillation [facility])    Erectile dysfunction    Nephrolithiasis    Peripheral neuropathy    Skin cancer    Past Surgical History:  Procedure Laterality Date   BACK SURGERY  12/18/1974   disc surgery    CARPAL TUNNEL RELEASE  2009, 2010   bilat   CATARACT EXTRACTION     KIDNEY STONE SURGERY     LUMBAR LAMINECTOMY/DECOMPRESSION MICRODISCECTOMY  11/21/2012   Procedure: LUMBAR LAMINECTOMY/DECOMPRESSION MICRODISCECTOMY 2 LEVELS;  Surgeon: Hosie Spangle, MD;  Location: [facility];  Service: Neurosurgery;  Laterality: N/A;  Lumbar two to lumbar four laminectomies    Allergies  Allergies  Allergen Reactions   Sulfa Antibiotics Other (See Comments)    Unknown reaction.     Home Medications    Prior to Admission medications   Medication Sig Start Date End Date Taking? Authorizing Provider  Cholecalciferol 125 MCG (5000 UT) capsule Take 1 tablet by mouth daily.    [provider]  ELIQUIS 5 MG TABS tablet Take 1 tablet (5 mg total) by mouth 2 (two) times daily. 05/02/22   Satira Sark, MD  Misc Natural Products (NARCOSOFT HERBAL LAX PO) Take 1 tablet by mouth daily.    [provider]  Multiple Vitamin (MULTIVITAMIN) tablet Take 1 tablet by mouth daily.    [provider]  Multiple Vitamins-Minerals (PRESERVISION AREDS 2) CHEW Chew 1 tablet by mouth in the morning and at bedtime.    [provider]  Omega-3 Fatty Acids (FISH OIL) 1000 MG  CAPS Take 1 capsule by mouth in the morning and at bedtime.    [provider]  vitamin C (ASCORBIC ACID) 500 MG tablet Take 1 tablet by mouth daily.    [provider]  Zinc 25 MG TABS Take 1 tablet by mouth daily.    [provider]    Physical Exam    Vital Signs:  Matthew Berry does not have vital signs available for review today.  Given telephonic nature of communication, physical exam  is limited. AAOx3. NAD. Normal affect.  Speech and respirations are unlabored.  Accessory Clinical Findings    None  Assessment & Plan    1.  Preoperative Cardiovascular Risk Assessment: The patient is doing well from a cardiac perspective. Therefore, based on ACC/AHA guidelines, the patient would be at acceptable risk for the planned procedure without further cardiovascular testing. According to the Revised Cardiac Risk Index (RCRI), his Perioperative Risk of Major Cardiac Event is (%): 0.4 His Functional Capacity in METs is: 7.59 according to the [place] Activity Status Index (DASI).  The patient was advised that if he develops new symptoms prior to surgery to contact our office to arrange for a follow-up visit, and he verbalized understanding.  Per office protocol, patient can hold Eliquis for 1-2 days prior to procedure.     A copy of this note will be routed to requesting surgeon.  Time:   Today, I have spent 8 minutes with the patient with telehealth technology discussing medical history, symptoms, and management plan.     Emmaline Life, NP-C  12/01/2022, 1:57 PM [address], [address] Office [phone] Fax [phone]

## 2022-12-01 ENCOUNTER — Encounter: Payer: Self-pay | Admitting: Nurse Practitioner

## 2022-12-01 ENCOUNTER — Ambulatory Visit: Payer: Medicare Other | Attending: Cardiovascular Disease | Admitting: Nurse Practitioner

## 2022-12-01 DIAGNOSIS — Z01810 Encounter for preprocedural cardiovascular examination: Secondary | ICD-10-CM | POA: Diagnosis not present

## 2023-01-31 ENCOUNTER — Other Ambulatory Visit: Payer: Self-pay | Admitting: Cardiology

## 2023-01-31 NOTE — Telephone Encounter (Signed)
Prescription refill request for Eliquis received. Indication: AF Last office visit:  12/01/22  Elwyn Reach NP Scr: 0.99 on 04/27/22  Epic Age: 77 Weight: 82.4kg   Based on above findings Eliquis 36m twice daily is the appropriate dose.  Refill approved.

## 2023-03-03 NOTE — Progress Notes (Unsigned)
    Cardiology Office Note  Date: 03/05/2023   ID: Matthew Berry, DOB December 29, 1945, MRN [id number]  History of Present Illness: Matthew Berry is a 77 y.o. male last seen in August 2023.  He is here for a follow-up visit.  Reports no sense of palpitations, stable NYHA class I dyspnea, no exertional chest pain or unusual fatigue.  I reviewed his medications, he reports no spontaneous bleeding problems on Eliquis.  Remains off AV nodal blockers with heart rate in the 60s at rest today.  I reviewed his ECG which shows atrial fibrillation with nonspecific ST-T wave changes.  He had lab work in May of last year as noted below.  Physical Exam: VS:  BP 112/68   Pulse 62   Ht 5\' 11"  (1.803 m)   Wt 184 lb (83.5 kg)   SpO2 97%   BMI 25.66 kg/m , BMI Body mass index is 25.66 kg/m.  Wt Readings from Last 3 Encounters:  03/05/23 184 lb (83.5 kg)  08/10/22 177 lb 12.8 oz (80.6 kg)  04/19/22 178 lb 9.6 oz (81 kg)    General: Patient appears comfortable at rest. HEENT: Conjunctiva and lids normal. Neck: Supple, no elevated JVP or carotid bruits. Lungs: Clear to auscultation, nonlabored breathing at rest. Cardiac: Irregularly irregular, 2/6 systolic murmur, no gallop. Extremities: No pitting edema.  ECG:  An ECG dated 04/19/2022 was personally reviewed today and demonstrated:  Atrial fibrillation with nonspecific ST changes.  Labwork: 03/06/2022: TSH 2.564 03/23/2022: BUN 25; Creatinine, Ser 1.01; Hemoglobin 15.1; Magnesium 2.2; Platelets 209; Potassium 4.3; Sodium 140  May 2023: Cholesterol 103, triglycerides 105, HDL 41.5, LDL 40.5, potassium 4.6, BUN 17, creatinine 0.99, AST 14, ALT 13, hemoglobin 15.3, platelets 195, TSH 1.60  Other Studies Reviewed Today:  No interval cardiac testing for review today.  Assessment and Plan:  1.  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  ECG  reviewed and stable.  He remains asymptomatic in terms of palpitations and otherwise continues on Eliquis for stroke prophylaxis.  Reports no spontaneous bleeding problems, I reviewed his lab work from May 2023.  No changes were made today.  2.  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  Asymptomatic, no significant change in cardiac murmur.  Continue observation for now.  Disposition:  Follow up  6 months.  Signed, Satira Sark, M.D., F.A.C.C.

## 2023-03-05 ENCOUNTER — Encounter: Payer: Self-pay | Admitting: Cardiology

## 2023-03-05 ENCOUNTER — Ambulatory Visit: Payer: Medicare Other | Attending: Cardiology | Admitting: Cardiology

## 2023-03-05 VITALS — BP 112/68 | HR 62 | Ht 71.0 in | Wt 184.0 lb

## 2023-03-05 DIAGNOSIS — I4821 Permanent atrial fibrillation: Secondary | ICD-10-CM

## 2023-03-05 DIAGNOSIS — I350 Nonrheumatic aortic (valve) stenosis: Secondary | ICD-10-CM

## 2023-03-05 NOTE — Patient Instructions (Addendum)

## 2023-08-27 IMAGING — DX DG CHEST 2V
2 series · 2 of 2 positions shown · non-contrast
Comparison: 03/06/2022

CLINICAL DATA: Chest pain, fatigue and weakness.

EXAM:
CHEST - 2 VIEW

[chest pa]
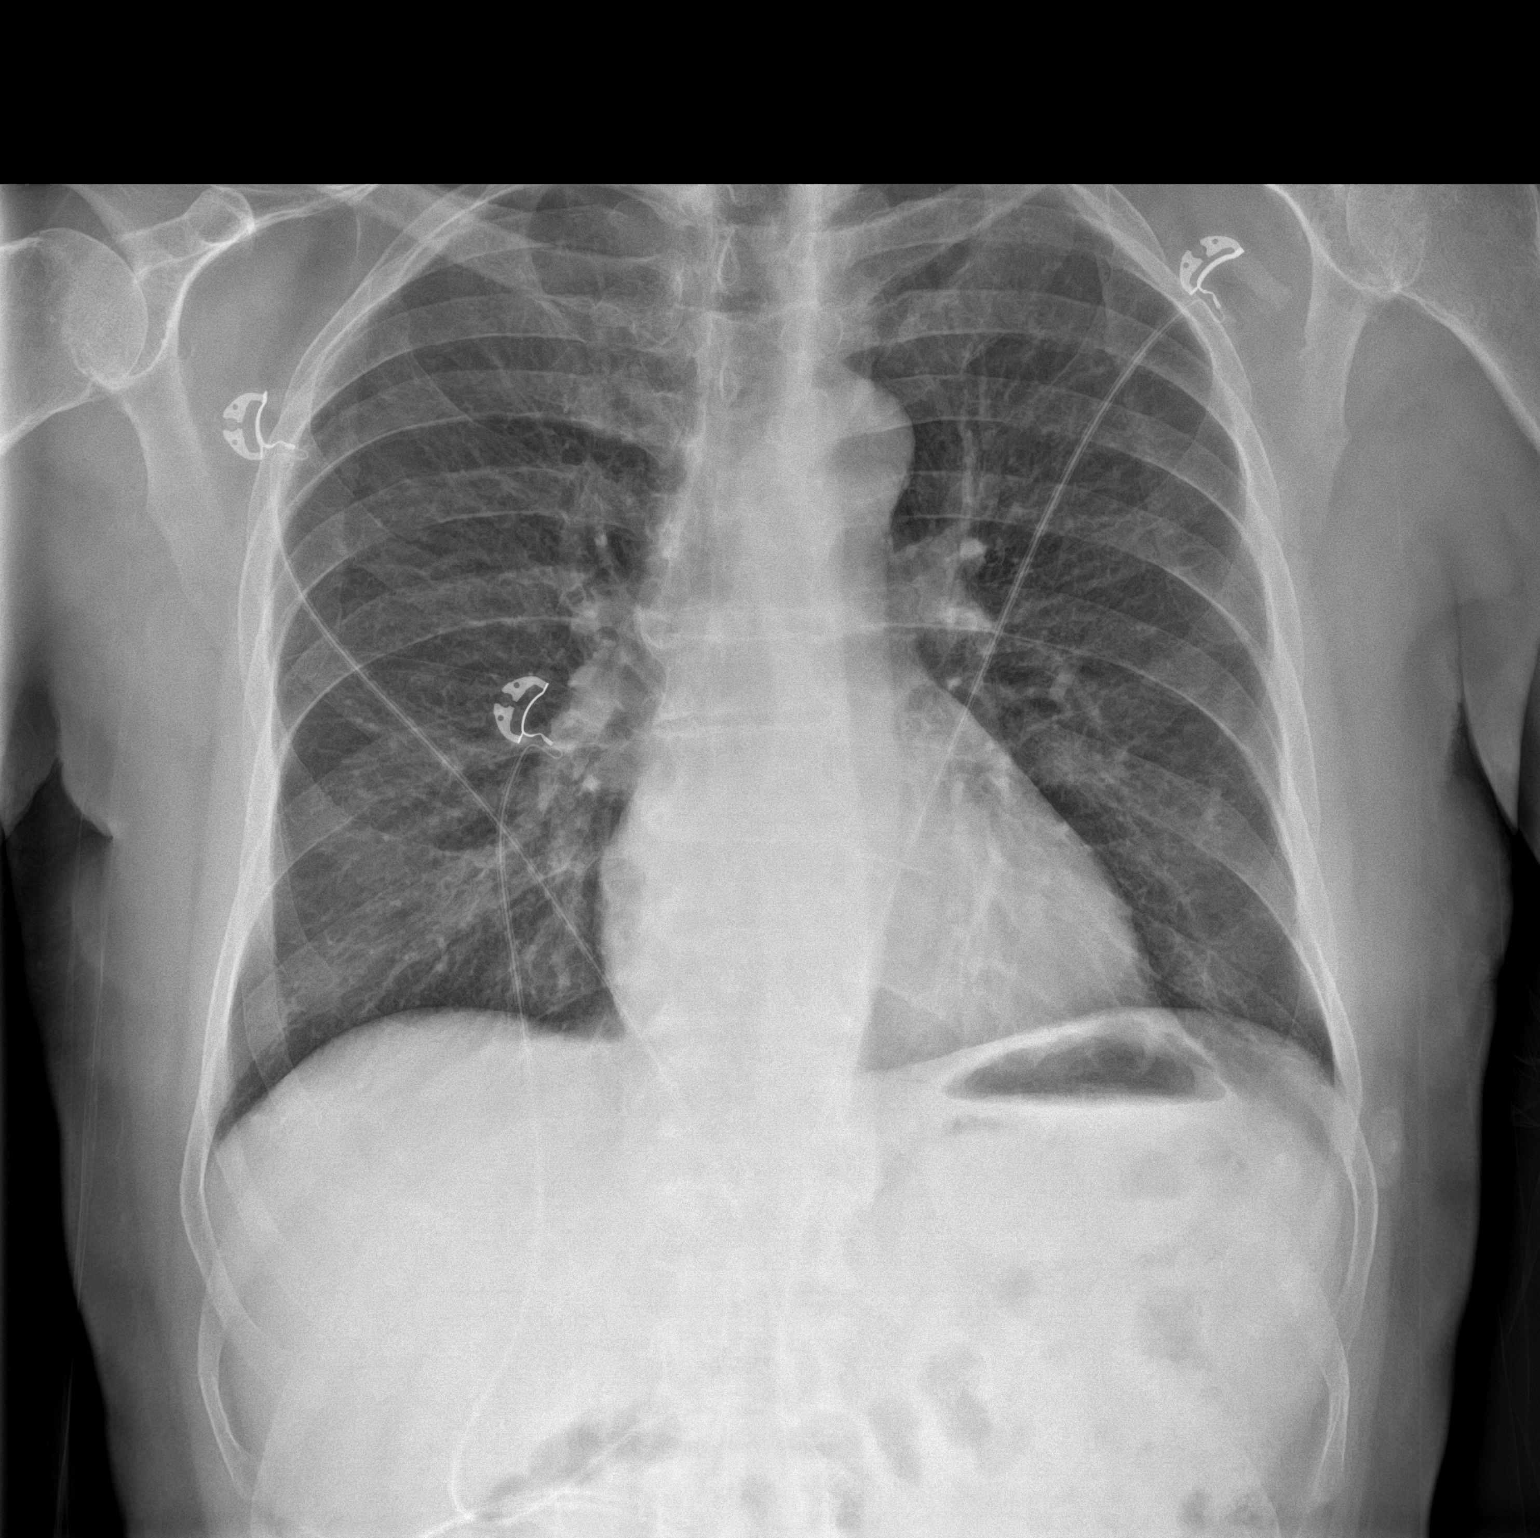

[chest lat]
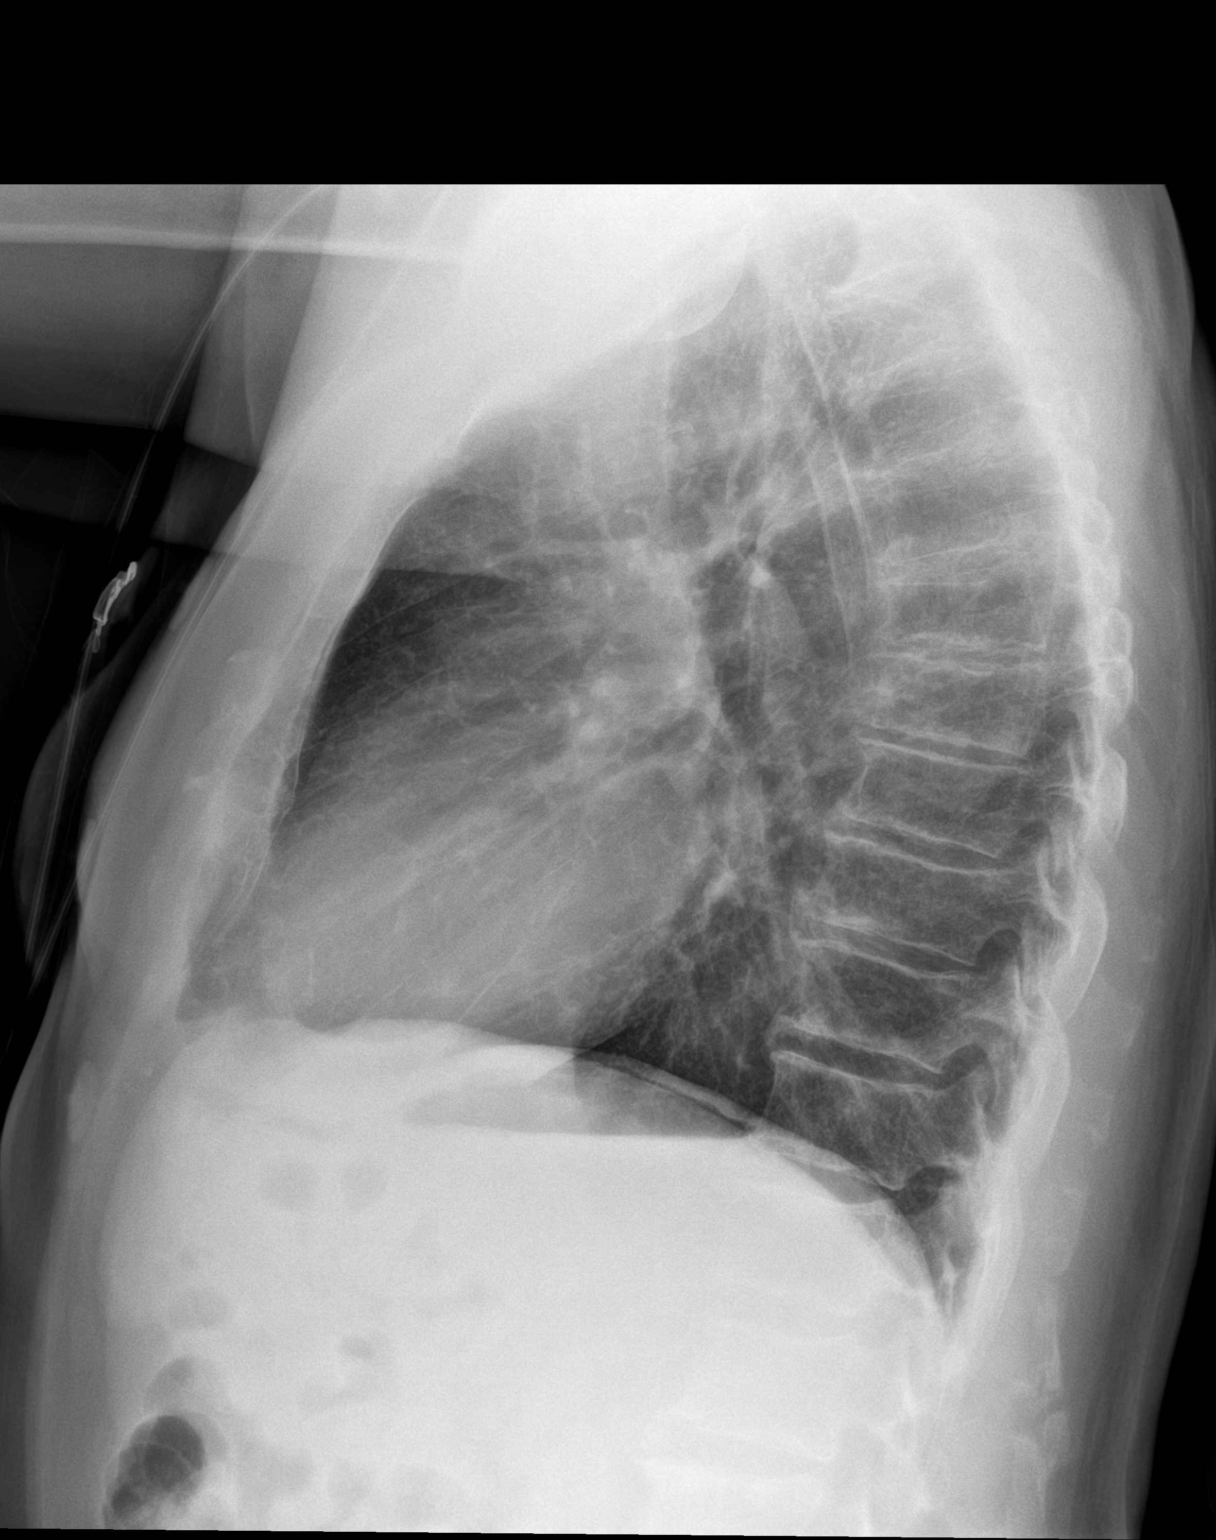

[2 of 2 positions shown; findings below may reference images not displayed]

FINDINGS: The heart size and mediastinal contours are within normal limits.
Mild chronic pulmonary interstitial prominence. There is no evidence
of pulmonary edema, consolidation, pneumothorax, nodule or pleural
fluid. The visualized skeletal structures are unremarkable.
IMPRESSION: Mild chronic pulmonary interstitial prominence.

## 2023-09-03 ENCOUNTER — Other Ambulatory Visit: Payer: Self-pay | Admitting: Cardiology

## 2023-09-03 DIAGNOSIS — I480 Paroxysmal atrial fibrillation: Secondary | ICD-10-CM

## 2023-09-03 NOTE — Telephone Encounter (Signed)
Eliquis 5mg  refill request received. Patient is 77 years old, weight-83.5kg, Crea-0.99 on 05/15/23 via [facility] from [facility], [place], and last seen by Dr. Diona Browner on 03/05/23. Dose is appropriate based on dosing criteria. Will send in refill to requested pharmacy.

## 2023-09-12 NOTE — Progress Notes (Unsigned)
Cardiology Office Note  Date: 09/13/2023   ID: [name], DOB [birth year], MRN [id number]  History of Present Illness: Matthew Berry is a 77 y.o. male last seen in March.  He is here for a routine visit.  Reports no palpitations, generally NYHA class II dyspnea.  He does mention feeling "indigestion" with higher levels of activity or if he gets overheated.  He noticed this at least a few times over the summer.  No known history of obstructive CAD, although no recent ischemic testing.  I reviewed his medications, he reports compliance with Eliquis and no spontaneous bleeding problems.  He is not on any AV nodal blockers for heart rate control.  I reviewed his lab work from May.  Physical Exam: VS:  BP 114/70   Pulse 61   Ht 5\' 11"  (1.803 m)   Wt 179 lb 3.2 oz (81.3 kg)   SpO2 96%   BMI 24.99 kg/m , BMI Body mass index is 24.99 kg/m.  Wt Readings from Last 3 Encounters:  09/13/23 179 lb 3.2 oz (81.3 kg)  03/05/23 184 lb (83.5 kg)  08/10/22 177 lb 12.8 oz (80.6 kg)    General: Patient appears comfortable at rest. HEENT: Conjunctiva and lids normal. Neck: Supple, no elevated JVP or carotid bruits. Lungs: Clear to auscultation, nonlabored breathing at rest. Cardiac: Irregularly irregular, 2/6 systolic murmur, no pericardial rub. Extremities: No pitting edema.  ECG:  An ECG dated 03/05/2023 was personally reviewed today and demonstrated:  Atrial fibrillation with nonspecific ST-T changes.  Labwork:  May 2024: Hemoglobin 15.4, platelets 209, potassium 4.6, BUN 21, creatinine 0.99, AST 25, ALT 21, cholesterol 114, triglycerides 102, HDL 40, LDL 54, TSH 1.6  Other Studies Reviewed Today:  No interval cardiac testing for review today.  Assessment and Plan:  1.  Intermittent, recurrent episodes of chest discomfort, he describes a feeling of indigestion with higher level activity as discussed above.  Concern is for angina, he has not undergone any recent ischemic testing.   Plan to proceed with a Lexiscan Myoview for further evaluation.  2.  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  Heart rate controlled in the absence of any AV nodal blockers.  Continue Eliquis for stroke prophylaxis.  I reviewed his lab work from May.  3.  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  No significant change in cardiac murmur.  Continue observation for now.  Disposition:  Follow up  6 months, sooner if needed.  Signed, Jonelle Sidle, M.D., F.A.C.C. [facility] at [facility]

## 2023-09-13 ENCOUNTER — Encounter: Payer: Self-pay | Admitting: *Deleted

## 2023-09-13 ENCOUNTER — Ambulatory Visit: Payer: Medicare Other | Attending: Cardiology | Admitting: Cardiology

## 2023-09-13 ENCOUNTER — Encounter: Payer: Self-pay | Admitting: Cardiology

## 2023-09-13 ENCOUNTER — Telehealth: Payer: Self-pay | Admitting: Cardiology

## 2023-09-13 VITALS — BP 114/70 | HR 61 | Ht 71.0 in | Wt 179.2 lb

## 2023-09-13 DIAGNOSIS — R079 Chest pain, unspecified: Secondary | ICD-10-CM | POA: Diagnosis present

## 2023-09-13 DIAGNOSIS — I350 Nonrheumatic aortic (valve) stenosis: Secondary | ICD-10-CM | POA: Insufficient documentation

## 2023-09-13 DIAGNOSIS — I4821 Permanent atrial fibrillation: Secondary | ICD-10-CM | POA: Insufficient documentation

## 2023-09-13 NOTE — Patient Instructions (Addendum)
Medication Instructions:  ?Your physician recommends that you continue on your current medications as directed. Please refer to the Current Medication list given to you today. ? ?Labwork: ?none ? ?Testing/Procedures: ?Your physician has requested that you have a lexiscan myoview. For further information please visit [email or web address] Please follow instruction sheet, as given. ? ?Follow-Up: ?Your physician recommends that you schedule a follow-up appointment in: 6 months ? ?Any Other Special Instructions Will Be Listed Below (If Applicable). ? ?If you need a refill on your cardiac medications before your next appointment, please call your pharmacy. ?

## 2023-09-13 NOTE — Telephone Encounter (Signed)
Checking percert on the following patient for testing scheduled at [facility].    LEXISCAN  09-19-2023

## 2023-09-18 ENCOUNTER — Telehealth (HOSPITAL_COMMUNITY): Payer: Self-pay | Admitting: Emergency Medicine

## 2023-09-18 NOTE — Telephone Encounter (Signed)
Left message to return call if he has any questions or concerns about Lexiscan on 09/19/23

## 2023-09-19 ENCOUNTER — Encounter (HOSPITAL_COMMUNITY): Payer: Self-pay

## 2023-09-19 ENCOUNTER — Encounter (HOSPITAL_COMMUNITY)
Admission: RE | Admit: 2023-09-19 | Discharge: 2023-09-19 | Disposition: A | Discharge status: Home / Self Care | Payer: Medicare Other | Source: Ambulatory Visit | Attending: Cardiology | Admitting: Cardiology

## 2023-09-19 ENCOUNTER — Ambulatory Visit (HOSPITAL_COMMUNITY)
Admission: RE | Admit: 2023-09-19 | Discharge: 2023-09-19 | Disposition: A | Discharge status: Home / Self Care | Payer: Medicare Other | Source: Ambulatory Visit | Attending: Cardiology | Admitting: Cardiology

## 2023-09-19 DIAGNOSIS — R079 Chest pain, unspecified: Secondary | ICD-10-CM | POA: Diagnosis present

## 2023-09-19 LAB — NM MYOCAR MULTI W/SPECT W/WALL MOTION / EF
LV dias vol: 94 mL (ref 62–150)
LV sys vol: 41 mL
Nuc Stress EF: 57 %
Peak HR: 98 {beats}/min
RATE: 0.4
Rest HR: 67 {beats}/min
Rest Nuclear Isotope Dose: 10.6 mCi
SDS: 2
SRS: 0
SSS: 2
ST Depression (mm): 0 mm
Stress Nuclear Isotope Dose: 33 mCi
TID: 1.08

## 2023-09-19 MED ORDER — TECHNETIUM TC 99M TETROFOSMIN IV KIT
30.0000 | PACK | Freq: Once | INTRAVENOUS | Status: AC | PRN
  Administered 2023-09-19: 33 via INTRAVENOUS

## 2023-09-19 MED ORDER — REGADENOSON 0.4 MG/5ML IV SOLN
INTRAVENOUS | Status: AC
  Administered 2023-09-19: 0.4 mg via INTRAVENOUS
  Filled 2023-09-19: qty 5

## 2023-09-19 MED ORDER — TECHNETIUM TC 99M TETROFOSMIN IV KIT
10.0000 | PACK | Freq: Once | INTRAVENOUS | Status: AC | PRN
  Administered 2023-09-19: 10.6 via INTRAVENOUS

## 2023-09-19 MED ORDER — SODIUM CHLORIDE FLUSH 0.9 % IV SOLN
INTRAVENOUS | Status: AC
  Administered 2023-09-19: 10 mL via INTRAVENOUS
  Filled 2023-09-19: qty 10

## 2024-03-17 ENCOUNTER — Encounter: Payer: Self-pay | Admitting: Cardiology

## 2024-03-17 ENCOUNTER — Ambulatory Visit: Payer: Medicare Other | Attending: Cardiology | Admitting: Cardiology

## 2024-03-17 VITALS — BP 118/62 | HR 85 | Ht 71.0 in | Wt 179.8 lb

## 2024-03-17 DIAGNOSIS — I350 Nonrheumatic aortic (valve) stenosis: Secondary | ICD-10-CM | POA: Diagnosis present

## 2024-03-17 DIAGNOSIS — I4821 Permanent atrial fibrillation: Secondary | ICD-10-CM | POA: Diagnosis not present

## 2024-03-17 NOTE — Addendum Note (Signed)
 Addended by: Eustace Moore on: 03/17/2024 09:47 AM   Modules accepted: Orders

## 2024-03-17 NOTE — Progress Notes (Signed)
    Cardiology Office Note  Date: 03/17/2024   ID: Matthew Berry, DOB March 26, 1946, MRN [id number]  History of Present Illness: Matthew Berry is a 78 y.o. male last seen in September 2024.  He is here today with his wife for a follow-up visit.  Reports no palpitations or increasing dyspnea with typical activities.  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  He does not describe any recurring chest pain with activity.  Does indicate evaluation of symptoms in [place] with concern for possible angina, although ended up being more consistent with reflux and improvement on Protonix which she took for about a month.  I reviewed his medications.  He does not report any spontaneous bleeding problems on Eliquis.  I reviewed his ECG today which shows rate controlled atrial fibrillation.  Physical Exam: VS:  BP 118/62   Pulse 85   Ht 5\' 11"  (1.803 m)   Wt 179 lb 12.8 oz (81.6 kg)   SpO2 96%   BMI 25.08 kg/m , BMI Body mass index is 25.08 kg/m.  Wt Readings from Last 3 Encounters:  03/17/24 179 lb 12.8 oz (81.6 kg)  09/13/23 179 lb 3.2 oz (81.3 kg)  03/05/23 184 lb (83.5 kg)    General: Patient appears comfortable at rest. HEENT: Conjunctiva and lids normal. Neck: Supple, no elevated JVP or carotid bruits. Lungs: Clear to auscultation, nonlabored breathing at rest. Cardiac: Irregular irregular, 2/6 systolic murmur, no gallop.  ECG:  An ECG dated 03/05/2023 was personally reviewed today and demonstrated:  Atrial fibrillation with nonspecific ST-T changes.  Labwork:  May 2024: Hemoglobin 15.4, platelets 209, potassium 4.6, BUN 21, creatinine 0.99, AST 25, ALT 21, cholesterol 114, HDL 40, LDL 54, triglycerides 102, TSH 1.24 January 2024: Hemoglobin 15.2, platelets 212, potassium 4.2, creatinine 0.91, AST 23, ALT 19  Other Studies Reviewed Today:  Lexiscan Myoview 09/19/2023:   Findings are consistent with no ischemia. The study is low risk.   No ST deviation was  noted. The ECG was negative for ischemia.   LV perfusion is normal.  Diaphragmatic attenuation present, no significant myocardial perfusion defects to indicate scar or ischemia.   Left ventricular function is normal. Nuclear stress EF: 57%.   Low risk study with no evidence of ischemia and LVEF 57%.  Assessment and Plan:  1.  Intermittent, recurrent episodes of chest discomfort, follow-up Lexiscan Myoview in October 2024 was low risk, negative for ischemia with LVEF 57%.  He does not report any progressive symptoms in the interim.  Continue observation for now.   2.  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  No palpitations reported.  Continue Eliquis 5 mg twice daily for stroke prophylaxis.  He does not report any spontaneous bleeding problems.  I reviewed his recent lab work from February.   3.  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  Update echocardiogram for clinical visit in 1 year.  Disposition:  Follow up  1 year.  Signed, Jonelle Sidle, M.D., F.A.C.C. [facility] at [facility]

## 2024-03-17 NOTE — Patient Instructions (Addendum)
 Medication Instructions:  Your physician recommends that you continue on your current medications as directed. Please refer to the Current Medication list given to you today.  Labwork: none  Testing/Procedures: Your physician has requested that you have an echocardiogram in 1 year just before your next visit. Echocardiography is a painless test that uses sound waves to create images of your heart. It provides your doctor with information about the size and shape of your heart and how well your heart's chambers and valves are working. This procedure takes approximately one hour. There are no restrictions for this procedure. Please do NOT wear cologne, perfume, aftershave, or lotions (deodorant is allowed). Please arrive 15 minutes prior to your appointment time.  Please note: We ask at that you not bring children with you during ultrasound (echo/ vascular) testing. Due to room size and safety concerns, children are not allowed in the ultrasound rooms during exams. Our front office staff cannot provide observation of children in our lobby area while testing is being conducted. An adult accompanying a patient to their appointment will only be allowed in the ultrasound room at the discretion of the ultrasound technician under special circumstances. We apologize for any inconvenience.  Follow-Up: Your physician recommends that you schedule a follow-up appointment in: 1 year. You will receive a reminder call in about 10 months reminding you to schedule your appointment. If you don't receive this call, please contact our office.  Any Other Special Instructions Will Be Listed Below (If Applicable).  If you need a refill on your cardiac medications before your next appointment, please call your pharmacy.

## 2024-03-24 ENCOUNTER — Other Ambulatory Visit: Payer: Self-pay | Admitting: Cardiology

## 2024-03-24 DIAGNOSIS — I480 Paroxysmal atrial fibrillation: Secondary | ICD-10-CM

## 2024-03-24 NOTE — Telephone Encounter (Signed)
 Prescription refill request for Eliquis received. Indication: Afib  Last office visit: 3/31/5 Diona Browner)  Scr: 0.99 (05/15/23)  Age: 78 Weight: 81.6kg  Appropriate dose. Refill sent.

## 2024-04-22 ENCOUNTER — Ambulatory Visit: Attending: Cardiology

## 2024-04-22 DIAGNOSIS — I350 Nonrheumatic aortic (valve) stenosis: Secondary | ICD-10-CM | POA: Diagnosis present

## 2024-04-23 LAB — ECHOCARDIOGRAM COMPLETE
AR max vel: 1.57 cm2
AV Area VTI: 1.72 cm2
AV Area mean vel: 1.62 cm2
AV Mean grad: 8.5 mmHg
AV Peak grad: 17.5 mmHg
AV Vena cont: 0.6 cm
Ao pk vel: 2.09 m/s
Calc EF: 55.6 %
MV VTI: 3.24 cm2
P 1/2 time: 684 ms
S' Lateral: 3 cm
Single Plane A2C EF: 41.1 %
Single Plane A4C EF: 68 %

## 2024-09-13 ENCOUNTER — Other Ambulatory Visit: Payer: Self-pay | Admitting: Cardiology

## 2024-09-13 DIAGNOSIS — I480 Paroxysmal atrial fibrillation: Secondary | ICD-10-CM

## 2024-09-15 NOTE — Telephone Encounter (Signed)
 Prescription refill request for Eliquis  received. Indication: AF Last office visit: 03/17/24  GORMAN Sierras MD Scr: 1.08 on 05/23/24  Epic Age: 78 Weight: 81.6kg   Based on above findings Eliquis  5mg  twice daily is the appropriate dose.  Refill approved.

## 2025-01-05 ENCOUNTER — Telehealth: Payer: Self-pay | Admitting: Cardiology

## 2025-01-05 DIAGNOSIS — I350 Nonrheumatic aortic (valve) stenosis: Secondary | ICD-10-CM

## 2025-01-05 NOTE — Telephone Encounter (Signed)
 Pt wife calling to ask echo order be put in in preporation for husbands office appt in April. Please advise.

## 2025-01-05 NOTE — Telephone Encounter (Signed)
 Called patient in regards to Echo order advised her someone from scheduling will call to make the appointment. Patient verbalized understanding

## 2025-01-27 ENCOUNTER — Ambulatory Visit

## 2025-03-12 ENCOUNTER — Ambulatory Visit

## 2025-03-31 ENCOUNTER — Ambulatory Visit: Admitting: Cardiology
